# Patient Record
Sex: Male | Born: 1965
Health system: Southern US, Community
[De-identification: ages and names within clinical notes are randomized; demographics above are authoritative.]

## PROBLEM LIST (undated history)

## (undated) DIAGNOSIS — N486 Induration penis plastica: Secondary | ICD-10-CM

## (undated) DIAGNOSIS — N200 Calculus of kidney: Secondary | ICD-10-CM

## (undated) DIAGNOSIS — N529 Male erectile dysfunction, unspecified: Secondary | ICD-10-CM

## (undated) DIAGNOSIS — E785 Hyperlipidemia, unspecified: Secondary | ICD-10-CM

## (undated) HISTORY — DX: Induration penis plastica: N48.6

## (undated) HISTORY — PX: OTHER SURGICAL HISTORY: SHX169

## (undated) HISTORY — DX: Male erectile dysfunction, unspecified: N52.9

## (undated) HISTORY — PX: POLYPECTOMY: SHX149

## (undated) HISTORY — DX: Calculus of kidney: N20.0

## (undated) HISTORY — DX: Hyperlipidemia, unspecified: E78.5

## (undated) HISTORY — PX: COLONOSCOPY: SHX174

---

## 2002-02-03 ENCOUNTER — Encounter: Payer: Self-pay | Admitting: Family Medicine

## 2002-02-03 ENCOUNTER — Encounter: Admission: RE | Admit: 2002-02-03 | Discharge: 2002-02-03 | Payer: Self-pay | Admitting: Family Medicine

## 2002-03-10 ENCOUNTER — Ambulatory Visit (HOSPITAL_BASED_OUTPATIENT_CLINIC_OR_DEPARTMENT_OTHER): Admission: RE | Admit: 2002-03-10 | Discharge: 2002-03-10 | Payer: Self-pay | Admitting: Urology

## 2002-03-10 ENCOUNTER — Encounter: Payer: Self-pay | Admitting: Urology

## 2005-07-25 ENCOUNTER — Encounter: Admission: RE | Admit: 2005-07-25 | Discharge: 2005-07-25 | Payer: Self-pay | Admitting: Family Medicine

## 2007-04-12 ENCOUNTER — Ambulatory Visit: Payer: Self-pay | Admitting: Family Medicine

## 2007-04-13 ENCOUNTER — Encounter (INDEPENDENT_AMBULATORY_CARE_PROVIDER_SITE_OTHER): Payer: Self-pay | Admitting: *Deleted

## 2007-04-13 LAB — CONVERTED CEMR LAB
Cholesterol: 186 mg/dL (ref 0–200)
Direct LDL: 95 mg/dL
Glucose, Bld: 233 mg/dL — ABNORMAL HIGH (ref 70–99)
HDL: 35.1 mg/dL — ABNORMAL LOW (ref >39.0)
PSA: 0.51 ng/mL (ref 0.10–4.00)
Triglycerides: 317 mg/dL (ref 0–149)

## 2007-05-10 ENCOUNTER — Ambulatory Visit: Payer: Self-pay | Admitting: Family Medicine

## 2007-05-12 ENCOUNTER — Telehealth (INDEPENDENT_AMBULATORY_CARE_PROVIDER_SITE_OTHER): Payer: Self-pay | Admitting: *Deleted

## 2007-05-12 LAB — CONVERTED CEMR LAB
Creatinine,U: 117.1 mg/dL
Microalb Creat Ratio: 17.1 mg/g (ref 0.0–30.0)
Microalb, Ur: 2 mg/dL — ABNORMAL HIGH (ref 0.0–1.9)

## 2007-05-19 ENCOUNTER — Ambulatory Visit: Payer: Self-pay | Admitting: Family Medicine

## 2007-05-19 DIAGNOSIS — E119 Type 2 diabetes mellitus without complications: Secondary | ICD-10-CM

## 2007-05-25 LAB — CONVERTED CEMR LAB
CO2: 29 meq/L (ref 19–32)
Calcium: 9.5 mg/dL (ref 8.4–10.5)
GFR calc Af Amer: 106 mL/min
Glucose, Bld: 230 mg/dL — ABNORMAL HIGH (ref 70–99)
Potassium: 3.8 meq/L (ref 3.5–5.1)

## 2007-05-26 ENCOUNTER — Encounter (INDEPENDENT_AMBULATORY_CARE_PROVIDER_SITE_OTHER): Payer: Self-pay | Admitting: *Deleted

## 2007-06-01 ENCOUNTER — Telehealth (INDEPENDENT_AMBULATORY_CARE_PROVIDER_SITE_OTHER): Payer: Self-pay | Admitting: Family Medicine

## 2007-06-07 ENCOUNTER — Encounter: Admission: RE | Admit: 2007-06-07 | Discharge: 2007-06-07 | Payer: Self-pay | Admitting: Family Medicine

## 2007-06-07 ENCOUNTER — Encounter (INDEPENDENT_AMBULATORY_CARE_PROVIDER_SITE_OTHER): Payer: Self-pay | Admitting: Family Medicine

## 2007-07-09 ENCOUNTER — Ambulatory Visit: Payer: Self-pay | Admitting: Internal Medicine

## 2007-08-24 ENCOUNTER — Encounter: Payer: Self-pay | Admitting: Internal Medicine

## 2007-09-10 ENCOUNTER — Ambulatory Visit: Payer: Self-pay | Admitting: Internal Medicine

## 2007-09-14 ENCOUNTER — Encounter (INDEPENDENT_AMBULATORY_CARE_PROVIDER_SITE_OTHER): Payer: Self-pay | Admitting: *Deleted

## 2007-09-14 LAB — CONVERTED CEMR LAB
AST: 21 units/L (ref 0–37)
Basophils Absolute: 0 10*3/uL (ref 0.0–0.1)
Basophils Relative: 0.1 % (ref 0.0–1.0)
Creatinine, Ser: 1 mg/dL (ref 0.4–1.5)
Eosinophils Absolute: 0.1 10*3/uL (ref 0.0–0.7)
Hemoglobin: 16.3 g/dL (ref 13.0–17.0)
Hgb A1c MFr Bld: 5.7 % (ref 4.6–6.0)
MCHC: 34.2 g/dL (ref 30.0–36.0)
Monocytes Absolute: 0.4 10*3/uL (ref 0.1–1.0)
Neutro Abs: 2.4 10*3/uL (ref 1.4–7.7)

## 2007-12-14 ENCOUNTER — Telehealth (INDEPENDENT_AMBULATORY_CARE_PROVIDER_SITE_OTHER): Payer: Self-pay | Admitting: *Deleted

## 2007-12-22 ENCOUNTER — Ambulatory Visit: Payer: Self-pay | Admitting: Internal Medicine

## 2007-12-23 ENCOUNTER — Encounter (INDEPENDENT_AMBULATORY_CARE_PROVIDER_SITE_OTHER): Payer: Self-pay | Admitting: *Deleted

## 2007-12-23 LAB — CONVERTED CEMR LAB
Cholesterol: 157 mg/dL (ref 0–200)
Hgb A1c MFr Bld: 5.3 % (ref 4.6–6.0)
Triglycerides: 134 mg/dL (ref 0–149)

## 2007-12-25 ENCOUNTER — Emergency Department (HOSPITAL_BASED_OUTPATIENT_CLINIC_OR_DEPARTMENT_OTHER): Admission: EM | Admit: 2007-12-25 | Discharge: 2007-12-25 | Payer: Self-pay | Admitting: Emergency Medicine

## 2008-05-01 ENCOUNTER — Ambulatory Visit: Payer: Self-pay | Admitting: Internal Medicine

## 2008-05-01 LAB — HM DIABETES FOOT EXAM

## 2008-05-08 LAB — CONVERTED CEMR LAB
ALT: 16 units/L (ref 0–53)
AST: 18 units/L (ref 0–37)
BUN: 11 mg/dL (ref 6–23)
Chloride: 102 meq/L (ref 96–112)
Creatinine,U: 120.9 mg/dL
GFR calc Af Amer: 120 mL/min
GFR calc non Af Amer: 99 mL/min
Glucose, Bld: 87 mg/dL (ref 70–99)
Microalb Creat Ratio: 3.3 mg/g (ref 0.0–30.0)
Microalb, Ur: 0.4 mg/dL (ref 0.0–1.9)

## 2008-08-14 ENCOUNTER — Telehealth (INDEPENDENT_AMBULATORY_CARE_PROVIDER_SITE_OTHER): Payer: Self-pay | Admitting: *Deleted

## 2008-11-13 ENCOUNTER — Ambulatory Visit: Payer: Self-pay | Admitting: Internal Medicine

## 2008-11-14 ENCOUNTER — Encounter (INDEPENDENT_AMBULATORY_CARE_PROVIDER_SITE_OTHER): Payer: Self-pay | Admitting: *Deleted

## 2008-11-24 ENCOUNTER — Ambulatory Visit: Payer: Self-pay | Admitting: Internal Medicine

## 2008-11-28 ENCOUNTER — Ambulatory Visit: Payer: Self-pay | Admitting: Internal Medicine

## 2008-11-30 ENCOUNTER — Encounter (INDEPENDENT_AMBULATORY_CARE_PROVIDER_SITE_OTHER): Payer: Self-pay | Admitting: *Deleted

## 2008-11-30 LAB — CONVERTED CEMR LAB
ALT: 18 units/L (ref 0–53)
AST: 20 units/L (ref 0–37)
Basophils Relative: 0.4 % (ref 0.0–3.0)
CO2: 31 meq/L (ref 19–32)
Chloride: 103 meq/L (ref 96–112)
Eosinophils Relative: 1.7 % (ref 0.0–5.0)
GFR calc non Af Amer: 98.12 mL/min (ref >60)
Glucose, Bld: 101 mg/dL — ABNORMAL HIGH (ref 70–99)
LDL Cholesterol: 92 mg/dL (ref 0–99)
Lymphocytes Relative: 41.1 % (ref 12.0–46.0)
MCV: 93.8 fL (ref 78.0–100.0)
Neutrophils Relative %: 49.5 % (ref 43.0–77.0)
Platelets: 175 10*3/uL (ref 150.0–400.0)
Potassium: 4.4 meq/L (ref 3.5–5.1)
RBC: 4.91 M/uL (ref 4.22–5.81)
RDW: 11.8 % (ref 11.5–14.6)
Total CHOL/HDL Ratio: 4

## 2009-03-12 ENCOUNTER — Ambulatory Visit: Payer: Self-pay | Admitting: Internal Medicine

## 2009-03-19 ENCOUNTER — Encounter (INDEPENDENT_AMBULATORY_CARE_PROVIDER_SITE_OTHER): Payer: Self-pay | Admitting: *Deleted

## 2009-05-28 ENCOUNTER — Ambulatory Visit: Payer: Self-pay | Admitting: Internal Medicine

## 2009-06-19 ENCOUNTER — Encounter (INDEPENDENT_AMBULATORY_CARE_PROVIDER_SITE_OTHER): Payer: Self-pay | Admitting: *Deleted

## 2009-07-17 ENCOUNTER — Ambulatory Visit: Payer: Self-pay | Admitting: Internal Medicine

## 2009-07-17 ENCOUNTER — Encounter: Payer: Self-pay | Admitting: Internal Medicine

## 2009-07-20 LAB — CONVERTED CEMR LAB: Hgb A1c MFr Bld: 5.4 % (ref 4.6–6.5)

## 2010-03-28 ENCOUNTER — Telehealth (INDEPENDENT_AMBULATORY_CARE_PROVIDER_SITE_OTHER): Payer: Self-pay | Admitting: *Deleted

## 2010-03-29 ENCOUNTER — Telehealth (INDEPENDENT_AMBULATORY_CARE_PROVIDER_SITE_OTHER): Payer: Self-pay | Admitting: *Deleted

## 2010-04-01 ENCOUNTER — Ambulatory Visit
Admission: RE | Admit: 2010-04-01 | Discharge: 2010-04-01 | Payer: Self-pay | Source: Home / Self Care | Attending: Internal Medicine | Admitting: Internal Medicine

## 2010-04-01 ENCOUNTER — Other Ambulatory Visit: Payer: Self-pay | Admitting: Internal Medicine

## 2010-04-01 DIAGNOSIS — F17200 Nicotine dependence, unspecified, uncomplicated: Secondary | ICD-10-CM | POA: Insufficient documentation

## 2010-04-01 LAB — CBC WITH DIFFERENTIAL/PLATELET
Basophils Absolute: 0 10*3/uL (ref 0.0–0.1)
Basophils Relative: 0.4 % (ref 0.0–3.0)
Eosinophils Absolute: 0.1 10*3/uL (ref 0.0–0.7)
Eosinophils Relative: 1.4 % (ref 0.0–5.0)
HCT: 45.5 % (ref 39.0–52.0)
Hemoglobin: 15.6 g/dL (ref 13.0–17.0)
Lymphocytes Relative: 39.2 % (ref 12.0–46.0)
Lymphs Abs: 2.4 10*3/uL (ref 0.7–4.0)
MCHC: 34.3 g/dL (ref 30.0–36.0)
MCV: 93.4 fl (ref 78.0–100.0)
Monocytes Absolute: 0.4 10*3/uL (ref 0.1–1.0)
Monocytes Relative: 7.2 % (ref 3.0–12.0)
Neutro Abs: 3.1 10*3/uL (ref 1.4–7.7)
Neutrophils Relative %: 51.8 % (ref 43.0–77.0)
Platelets: 185 10*3/uL (ref 150.0–400.0)
RBC: 4.87 Mil/uL (ref 4.22–5.81)
RDW: 12.4 % (ref 11.5–14.6)
WBC: 6 10*3/uL (ref 4.5–10.5)

## 2010-04-01 LAB — BASIC METABOLIC PANEL
BUN: 13 mg/dL (ref 6–23)
CO2: 26 mEq/L (ref 19–32)
Calcium: 10 mg/dL (ref 8.4–10.5)
Chloride: 103 mEq/L (ref 96–112)
Creatinine, Ser: 0.9 mg/dL (ref 0.4–1.5)
GFR: 101.39 mL/min (ref >60.00)
Glucose, Bld: 123 mg/dL — ABNORMAL HIGH (ref 70–99)
Potassium: 4.4 mEq/L (ref 3.5–5.1)
Sodium: 138 mEq/L (ref 135–145)

## 2010-04-01 LAB — LIPID PANEL
Cholesterol: 177 mg/dL (ref 0–200)
HDL: 43 mg/dL (ref >39.00)
Total CHOL/HDL Ratio: 4
Triglycerides: 249 mg/dL — ABNORMAL HIGH (ref 0.0–149.0)
VLDL: 49.8 mg/dL — ABNORMAL HIGH (ref 0.0–40.0)

## 2010-04-01 LAB — HEMOGLOBIN A1C: Hgb A1c MFr Bld: 5.9 % (ref 4.6–6.5)

## 2010-04-01 LAB — AST: AST: 27 U/L (ref 0–37)

## 2010-04-01 LAB — ALT: ALT: 39 U/L (ref 0–53)

## 2010-04-01 LAB — LDL CHOLESTEROL, DIRECT: Direct LDL: 107.1 mg/dL

## 2010-04-23 NOTE — Assessment & Plan Note (Signed)
Summary: 4 MONTH OV//PH   Vital Signs:  Patient profile:   45 year old male Height:      73 inches  Vitals Entered By: Shary Decamp (July 17, 2009 8:38 AM) CC: pt not seen due to MD having a meeting.  Patient fasting, labs drawn, pt will reschedule appt   Allergies: No Known Drug Allergies   Complete Medication List: 1)  Freestyle Lite Strp (Glucose blood) .... Use as directed once daily 2)  Freestyle Lancets Misc (Lancets) .... Use one daily as directed. 3)  Glucophage 500 Mg Tabs (Metformin hcl) .... 1/2 by mouth two times a day 4)  Amoxicillin 500 Mg Caps (Amoxicillin) .... 2 by mouth two times a day

## 2010-04-23 NOTE — Assessment & Plan Note (Signed)
Summary: fever/congestion in chest/kdc   Vital Signs:  Patient profile:   45 year old male Height:      73 inches Weight:      210 pounds Temp:     98.4 degrees F BP sitting:   122 / 80  Vitals Entered By: Shary Decamp (May 28, 2009 11:30 AM) CC: acute only Comments  - started with PN drip, cough 1 week ago  - bloody nasal d/c, fever x yesterday Shary Decamp  May 28, 2009 11:31 AM    History of Present Illness: sick  for 7 to 10 days, call, postnasal dripping a few days ago, he had an increased amount of nasal discharge with some blood his daughter was diagnosed with bilateral otitis and pharyngitis  Allergies: No Known Drug Allergies  Past History:  Past Medical History: Reviewed history from 07/09/2007 and no changes required. Diabetes mellitus, type II Dx 2-09  Past Surgical History: Reviewed history from 04/12/2007 and no changes required. kidney stone extraction  Social History: Reviewed history from 11/13/2008 and no changes required. Occupation:  Administrator, arts Married Drug use-no 3 children Regular exercise-yes (walks daily) diet-- healthy Never Smoked Alcohol use-no  Review of Systems       developed fever yesterday no nausea or vomiting no chest pain or shortness of breath.  No chest congestion he does have sinus tenderness, no sore throat  Physical Exam  General:  alert and well-developed.  no apparent distress Head:  face symmetric, no tender  to the patient Ears:  R ear normal and L ear normal.   Nose:  congested Mouth:  no redness or discharge Lungs:  normal respiratory effort, no intercostal retractions, no accessory muscle use, and normal breath sounds.   Heart:  normal rate, regular rhythm, and no murmur.     Impression & Recommendations:  Problem # 1:  SINUSITIS - ACUTE-NOS (ICD-461.9) see instructions His updated medication list for this problem includes:    Amoxicillin 500 Mg Caps (Amoxicillin) .Marland Kitchen... 2 by mouth  two times a day  Complete Medication List: 1)  Freestyle Lite Strp (Glucose blood) .... Use as directed once daily 2)  Freestyle Lancets Misc (Lancets) .... Use one daily as directed. 3)  Glucophage 500 Mg Tabs (Metformin hcl) .... 1/2 by mouth two times a day 4)  Amoxicillin 500 Mg Caps (Amoxicillin) .... 2 by mouth two times a day  Patient Instructions: 1)  Get plenty of rest, drink lots of clear liquids, and use Tylenol 2)  mucinex DM as needed for cough 3)  sudafed 30mg  (behind the counter) 4 times a day for nasal congestion 4)  amoxicillin , antibiotic, x 10 days  5)  call if no better in few days  Prescriptions: AMOXICILLIN 500 MG CAPS (AMOXICILLIN) 2 by mouth two times a day  #40 x 0   Entered and Authorized by:   Nolon Rod. Marvelle Caudill MD   Signed by:   Nolon Rod. Darryll Raju MD on 05/28/2009   Method used:   Electronically to        [facility] (retail)       [address]       [place], [place]  [address]       Ph: [phone]       Fax: [phone]   RxID:   [id number]

## 2010-04-23 NOTE — Letter (Signed)
Summary: Primary Care Appointment Letter  [facility]  [address] [place], [place] [address]   Phone: [phone]  Fax: [phone]    06/19/2009 MRN: [id number]  Gregory Williams [address] [place], [place]  [address]  Dear Mr. [name],   Your Primary Care Physician Nelliston E. Paz MD has indicated that:    _______it is time to schedule an appointment.    _______you missed your appointment on______ and need to call and          reschedule.    _______you need to have lab work done.    _______you need to schedule an appointment discuss lab or test results.    ____x___you need to call to reschedule your appointment that is                       scheduled on _april 20,2011 @ 10:00sm________.     Please call our office as soon as possible. Our phone number is [phone]          [phone]_______. Please press option 1. Our office is open 8a-12noon and 1p-5p, Monday through Friday.     Thank you,    [facility]

## 2010-04-23 NOTE — Letter (Signed)
Summary: Primary Care Appointment Letter  [facility]  [address] [place], [place] [address]   Phone: [phone]  Fax: [phone]    06/19/2009 MRN: [id number]  ADYNN CASERES [address] [place], [place]  [address]  Dear Mr. [name],   Your Primary Care Physician Mart E. Paz MD has indicated that:    _______it is time to schedule an appointment.    _______you missed your appointment on______ and need to call and          reschedule.    _______you need to have lab work done.    _______you need to schedule an appointment discuss lab or test results.    _______you need to call to reschedule your appointment that is                       scheduled on _________.     Please call our office as soon as possible. Our phone number is [phone]          _________. Please press option 1. Our office is open 8a-12noon and 1p-5p, Monday through Friday.     Thank you,    [facility]

## 2010-04-25 NOTE — Progress Notes (Signed)
Summary: CPX due  Phone Note Outgoing Call   Call placed by: Army Fossa CMA,  March 28, 2010 9:13 AM Summary of Call: Pt is due for CPX, please schedule. Army Fossa CMA  March 28, 2010 9:13 AM   Follow-up for Phone Call        Patient now has an appt on 1.9.12. Follow-up by: Harold Barban,  March 28, 2010 10:56 AM

## 2010-04-25 NOTE — Progress Notes (Signed)
Summary: Refill Request  Phone Note Refill Request Call back at Home Phone [phone] Message from:  Patient on March 28, 2010 10:56 AM  Refills Requested: Medication #1:  Accu Check for OneTouch Plan   Notes: Needs new meter, lancets and strips due to insurance change. [facility] [address]  Next Appointment Scheduled: 1.9.12 Initial call taken by: Harold Barban,  March 28, 2010 10:56 AM    New/Updated Medications: ACCU-CHEK SOFT TOUCH DEVICE  MISC (LANCET DEVICES) as directed. ACCU-CHEK SOFT TOUCH LANCETS  MISC (LANCETS) as directed. ACCU-CHEK ADVANTAGE TEST  STRP (GLUCOSE BLOOD) as directed. Prescriptions: ACCU-CHEK ADVANTAGE TEST  STRP (GLUCOSE BLOOD) as directed.  #3 month x 3   Entered by:   Army Fossa CMA   Authorized by:   Nolon Rod. Paz MD   Signed by:   Army Fossa CMA on 03/28/2010   Method used:   Faxed to ...       [facility] (retail)       [address]       [place], [place]  [address]       Ph: [phone]       Fax: [phone]   RxID:   [id number] ACCU-CHEK SOFT TOUCH LANCETS  MISC (LANCETS) as directed.  #3 month x 3   Entered by:   Army Fossa CMA   Authorized by:   Nolon Rod. Paz MD   Signed by:   Army Fossa CMA on 03/28/2010   Method used:   Faxed to ...       [facility] (retail)       [address]       [place], [place]  [address]       Ph: [phone]       Fax: [phone]   RxID:   [id number] ACCU-CHEK SOFT TOUCH DEVICE  MISC (LANCET DEVICES) as directed.  #1 x 0   Entered by:   Army Fossa CMA   Authorized by:   Nolon Rod. Paz MD   Signed by:   Army Fossa CMA on 03/28/2010   Method used:   Faxed to ...       [facility] (retail)       [address]       [place], [place]  [address]       Ph: [phone]       Fax: [phone]   RxID:   [id number]   Appended Document: Refill  Request    Clinical Lists Changes  Medications: Added new medication of * ACCU-CHEK METER qd

## 2010-04-25 NOTE — Progress Notes (Signed)
  Phone Note Call from Patient   Caller: Daughter Summary of Call: Pts daughter called back and would like for a one touch meter to be sent in, his insurance will cover. Army Fossa CMA  March 29, 2010 2:40 PM     New/Updated Medications: * ONETOUCH GLUCOSE METER. test once daily- dx 250.00 ONETOUCH ULTRASOFT LANCETS  MISC (LANCETS) test once daily. dx 250.00 ONETOUCH ULTRA BLUE  STRP (GLUCOSE BLOOD) test once daily. dx 250.00 Prescriptions: ONETOUCH ULTRA BLUE  STRP (GLUCOSE BLOOD) test once daily. dx 250.00  #3 month x 3   Entered by:   Army Fossa CMA   Authorized by:   Nolon Rod. Paz MD   Signed by:   Army Fossa CMA on 03/29/2010   Method used:   Faxed to ...       [facility] (retail)       [address]       [place], [place]  [address]       Ph: [phone]       Fax: [phone]   RxID:   [id number] [facility] LANCETS  MISC (LANCETS) test once daily. dx 250.00  #3 month x 3    Entered by:   Army Fossa CMA   Authorized by:   Nolon Rod. Paz MD   Signed by:   Army Fossa CMA on 03/29/2010   Method used:   Faxed to ...       [facility] (retail)       [address]       [place], [place]  [address]       Ph: [phone]       Fax: [phone]   RxID:   [id number] [facility] GLUCOSE METER. test once daily- dx 250.00  #1 x 0   Entered by:   Army Fossa CMA   Authorized by:   Nolon Rod. Paz MD   Signed by:   Army Fossa CMA on 03/29/2010   Method used:   Faxed to ...       [facility] (retail)       [address]       [place], [place]  [address]       Ph: [phone]       Fax: [phone]   RxID:   [id number]

## 2010-04-25 NOTE — Assessment & Plan Note (Signed)
Summary: cpx//pt will fasting//lch   Vital Signs:  Patient profile:   45 year old male Height:      73 inches Weight:      233.50 pounds BMI:     30.92 Pulse rate:   84 / minute Pulse rhythm:   regular BP sitting:   142 / 86  (left arm) Cuff size:   regular  Vitals Entered By: Army Fossa CMA (April 01, 2010 8:27 AM) CC: CPX, fasting- no complaints  [facility]   History of Present Illness: complete physical exam here with his wife In the last few months, he has been under a lot of stress, job related. He actually is not doing nearly as well as far as diet and exercise and has gain  weight. See vital signs  Preventive Screening-Counseling & Management  Alcohol-Tobacco     Alcohol drinks/day: <1     Alcohol type: beer  Caffeine-Diet-Exercise     Type of exercise: 3 miles daily      Times/week: 7  Current Medications (verified): 1)  Freestyle Lite   Strp (Glucose Blood) .... Use As Directed Once Daily 2)  Freestyle Lancets   Misc (Lancets) .... Use One Daily As Directed. 3)  Glucophage 500 Mg  Tabs (Metformin Hcl) .... 1/2 By Mouth Two Times A Day. Due For Office Visit. 4)  Onetouch Glucose Meter. .... Test Once Daily- Dx 250.00 5)  Onetouch Ultrasoft Lancets  Misc (Lancets) .... Test Once Daily. Dx 250.00 6)  Onetouch Ultra Blue  Strp (Glucose Blood) .... Test Once Daily. Dx 250.00  Allergies (verified): No Known Drug Allergies  Past History:  Past Medical History: Reviewed history from 07/09/2007 and no changes required. Diabetes mellitus, type II Dx 2-09  Past Surgical History: Reviewed history from 04/12/2007 and no changes required. kidney stone extraction  Family History: Prostate cancer -- F dx  44 years of age. 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 CV:  Denies chest pain or discomfort and swelling of feet. Resp:  Denies cough and shortness of breath. GI:  Denies bloody stools, diarrhea, nausea, and vomiting. GU:  Denies dysuria and hematuria.  Physical Exam  General:  alert and well-developed.   Neck:  no masses.   Lungs:  normal respiratory effort, no intercostal retractions, no accessory muscle use, and normal breath sounds.   Heart:  normal rate, regular rhythm, and no murmur.   Abdomen:  soft, non-tender, no distention, no masses, no guarding, and no rigidity.   Extremities:  no lower extremity edema Psych:  Cognition and judgment appear intact. Alert and cooperative with normal attention span and concentration.    Impression & Recommendations:  Problem # 1:  PREVENTIVE HEALTH CARE (ICD-V70.0) Td 2006 recommend to get a flu shot at the pharmacy  Encourage to go back to his healthy lifestyle. As far as the stress (see HPI) , he thinks things are getting better consequently will reassess in 4 months  FH of late  Colon cancer  iFOB negative 9- 2010, to reassess every 2 years until age 37, then yearly  FH prostate Ca , screening every 2 years until age 22, then yearly  Labs  Orders: Venipuncture (04540) TLB-BMP (Basic Metabolic Panel-BMET) (80048-METABOL) TLB-CBC Platelet - w/Differential (85025-CBCD) TLB-Lipid Panel (80061-LIPID) TLB-AST (SGOT) (84450-SGOT) TLB-ALT (SGPT) (84460-ALT) Specimen Handling (98119)  Complete Medication List: 1)  Freestyle Lite Strp (Glucose blood) .... Use as directed once daily 2)  Freestyle Lancets Misc (Lancets) .... Use one daily as directed. 3)  Glucophage 500 Mg Tabs (Metformin hcl) .... 1/2 by mouth two times a day. due for office visit. 4)  Onetouch Glucose Meter.  .... Test once  daily- dx 250.00 5)  Onetouch Ultrasoft Lancets Misc (Lancets) .... Test once daily. dx 250.00 6)  Onetouch Ultra Blue Strp (Glucose blood) .... Test once daily. dx 250.00  Other Orders: TLB-A1C / Hgb A1C (Glycohemoglobin) (83036-A1C)  Patient Instructions: 1)  Please schedule a follow-up appointment in 4 months .    Orders Added: 1)  Venipuncture [36415] 2)  TLB-BMP (Basic Metabolic Panel-BMET) [80048-METABOL] 3)  TLB-CBC Platelet - w/Differential [85025-CBCD] 4)  TLB-Lipid Panel [80061-LIPID] 5)  TLB-A1C / Hgb A1C (Glycohemoglobin) [83036-A1C] 6)  TLB-AST (SGOT) [84450-SGOT] 7)  TLB-ALT (SGPT) [84460-ALT] 8)  Specimen Handling [99000] 9)  Est. Patient age 35-64 [58]     Risk Factors:  Alcohol use:  yes    Type:  beer    Drinks per day:  <1 Exercise:  yes    Times per week:  7    Type:  3 miles daily

## 2010-05-31 ENCOUNTER — Ambulatory Visit (INDEPENDENT_AMBULATORY_CARE_PROVIDER_SITE_OTHER): Payer: Managed Care, Other (non HMO) | Admitting: Internal Medicine

## 2010-05-31 ENCOUNTER — Encounter: Payer: Self-pay | Admitting: Internal Medicine

## 2010-05-31 DIAGNOSIS — N209 Urinary calculus, unspecified: Secondary | ICD-10-CM

## 2010-05-31 DIAGNOSIS — R319 Hematuria, unspecified: Secondary | ICD-10-CM

## 2010-05-31 LAB — CONVERTED CEMR LAB
Ketones, urine, test strip: NEGATIVE
Specific Gravity, Urine: 1.02
Urobilinogen, UA: 0.2
WBC Urine, dipstick: NEGATIVE
pH: 7.5

## 2010-06-01 ENCOUNTER — Encounter: Payer: Self-pay | Admitting: Internal Medicine

## 2010-06-01 LAB — CONVERTED CEMR LAB: Casts: NONE SEEN /lpf

## 2010-06-03 ENCOUNTER — Other Ambulatory Visit: Payer: Self-pay | Admitting: Internal Medicine

## 2010-06-03 DIAGNOSIS — N200 Calculus of kidney: Secondary | ICD-10-CM

## 2010-06-04 ENCOUNTER — Ambulatory Visit (INDEPENDENT_AMBULATORY_CARE_PROVIDER_SITE_OTHER)
Admission: RE | Admit: 2010-06-04 | Discharge: 2010-06-04 | Disposition: A | Discharge status: Home / Self Care | Payer: Managed Care, Other (non HMO) | Source: Ambulatory Visit | Attending: Internal Medicine | Admitting: Internal Medicine

## 2010-06-04 DIAGNOSIS — N200 Calculus of kidney: Secondary | ICD-10-CM

## 2010-06-11 NOTE — Assessment & Plan Note (Signed)
Summary: kidney stone???--pt not avail until fri///sph   Vital Signs:  Patient profile:   45 year old male Weight:      231.50 pounds Pulse rate:   76 / minute Pulse rhythm:   regular BP sitting:   124 / 82  (left arm) Cuff size:   regular  Vitals Entered By: Army Fossa CMA (May 31, 2010 11:05 AM) CC: Pt here feels like he has a kidney stone  [facility]    History of Present Illness:  3-1/2 weeks ago developed a pain in the right suprapubic area,  no radiation, increase after the patient urinates and has not changed by moving his torso or walking.   at the same time, he developed mild bilateral low back pain which is steady, nor radiation, worse when he sits or stands for too long.  above symptoms reminds him  to the  time he had a kidney stone.  ROS  No fevers  or chills No nausea, vomiting, bowel movements normal No flank or abdominal rash No dysuria or gross hematuria , no testicular pain No mass in the groin or suprapubic area   Current Medications (verified): 1)  Freestyle Lite   Strp (Glucose Blood) .... Use As Directed Once Daily 2)  Freestyle Lancets   Misc (Lancets) .... Use One Daily As Directed. 3)  Glucophage 500 Mg  Tabs (Metformin Hcl) .... 1/2 By Mouth Two Times A Day. Due For Office Visit. 4)  Onetouch Glucose Meter. .... Test Once Daily- Dx 250.00 5)  Onetouch Ultrasoft Lancets  Misc (Lancets) .... Test Once Daily. Dx 250.00 6)  Onetouch Ultra Blue  Strp (Glucose Blood) .... Test Once Daily. Dx 250.00  Allergies (verified): No Known Drug Allergies  Past History:  Past Medical History: Reviewed history from 07/09/2007 and no changes required. Diabetes mellitus, type II Dx 2-09  Past Surgical History: Reviewed history from 04/12/2007 and no changes required. kidney stone extraction  Social History: Reviewed history from 04/01/2010 and no changes required. 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   Abdomen:  soft, non-tender, normal bowel sounds, no distention, no masses, no guarding, no rigidity, and no inguinal hernia.   Genitalia:  uncircumcised, no hydrocele, no varicocele, no scrotal masses, no testicular masses or atrophy, no cutaneous lesions, and no urethral discharge.   Pulses:   normal femoral  pulses bilaterally Extremities:  no lower extremity edema Skin:   no rash noted at the abdomen or flanks   Impression & Recommendations:  Problem # 1:  ? of URINARY CALCULUS (ICD-592.9)   symptoms are atypical for urolithiasis however he presented in a similar fashion before. Urinalysis showed trace of blood  plan:  CT  w/ renal stone protocol  fluids If workup negative, this could be a  musculoskeletal pain.  Orders: Radiology Referral (Radiology)  Complete Medication List: 1)  Freestyle Lite Strp (Glucose blood) .... Use as directed once daily 2)  Freestyle Lancets Misc (Lancets) .... Use one daily as directed. 3)  Glucophage 500 Mg Tabs (Metformin hcl) .... 1/2 by mouth two times a day. due for office visit. 4)  Onetouch Glucose Meter.  .... Test once daily- dx 250.00 5)  Onetouch Ultrasoft Lancets Misc (Lancets) .... Test once daily. dx 250.00 6)  Onetouch Ultra Blue Strp (Glucose blood) .... Test once daily. dx  250.00  Other Orders: UA Dipstick w/o Micro (manual) ([address]) T-Culture, Urine ([address]454-09811) T-Urinalysis (91478-29562)  Patient Instructions: 1)  lots of fluids 2)  call if symptoms increase  3)  will do a CT to check for stones    Orders Added: 1)  UA Dipstick w/o Micro (manual) [81002] 2)  T-Culture, Urine [13086-57846] 3)  T-Urinalysis [81003-65000] 4)  Radiology Referral [Radiology] 5)  Est. Patient Level IV [[address]]    Laboratory Results   Urine Tests    Routine Urinalysis   Color:  yellow Appearance: Clear Glucose: negative   (Normal Range: Negative) Bilirubin: negative   (Normal Range: Negative) Ketone: negative   (Normal Range: Negative) Spec. Gravity: 1.020   (Normal Range: 1.003-1.035) Blood: trace-lysed   (Normal Range: Negative) pH: 7.5   (Normal Range: 5.0-8.0) Protein: negative   (Normal Range: Negative) Urobilinogen: 0.2   (Normal Range: 0-1) Nitrite: negative   (Normal Range: Negative) Leukocyte Esterace: negative   (Normal Range: Negative)    Comments: Army Fossa CMA  May 31, 2010 11:09 AM

## 2010-09-19 ENCOUNTER — Encounter: Payer: Self-pay | Admitting: Internal Medicine

## 2010-09-19 ENCOUNTER — Ambulatory Visit (INDEPENDENT_AMBULATORY_CARE_PROVIDER_SITE_OTHER): Payer: Managed Care, Other (non HMO) | Admitting: Internal Medicine

## 2010-09-19 DIAGNOSIS — IMO0002 Reserved for concepts with insufficient information to code with codable children: Secondary | ICD-10-CM

## 2010-09-19 DIAGNOSIS — E119 Type 2 diabetes mellitus without complications: Secondary | ICD-10-CM

## 2010-09-19 DIAGNOSIS — F419 Anxiety disorder, unspecified: Secondary | ICD-10-CM | POA: Insufficient documentation

## 2010-09-19 DIAGNOSIS — F411 Generalized anxiety disorder: Secondary | ICD-10-CM

## 2010-09-19 DIAGNOSIS — M5410 Radiculopathy, site unspecified: Secondary | ICD-10-CM | POA: Insufficient documentation

## 2010-09-19 LAB — HEMOGLOBIN A1C: Hgb A1c MFr Bld: 6.5 % (ref 4.6–6.5)

## 2010-09-19 MED ORDER — CYCLOBENZAPRINE HCL 10 MG PO TABS: 10.0000 mg | ORAL_TABLET | Freq: Three times a day (TID) | ORAL | Status: AC | PRN

## 2010-09-19 MED ORDER — PREDNISONE 10 MG PO TABS: ORAL_TABLET | ORAL | Status: AC

## 2010-09-19 NOTE — Assessment & Plan Note (Signed)
Counseled  about diet exercise, labs.

## 2010-09-19 NOTE — Progress Notes (Signed)
  Subjective:    Patient ID: Gregory Williams, male    DOB: 02-11-1966, 45 y.o.   MRN: [id number]  HPI Followup on diabetes Additionally, he developed pain and a left trapezoid area 3 weeks ago. The pain started to radiate to the lateral aspect of the left arm shortly after, at this point the symptom is described as pain or numbness involves even the left thumb. The pain varies depending on his torso and arm position. Does not change with neck motion. Admits to a lot of anxiety related to work, we had a long discussion about it. He believes that the pain started when his to stress at work went up.  Past Medical History  Diagnosis Date  . Diabetes mellitus 2/09    Past Surgical History  Procedure Date  . Kidney stone extraction     Review of Systems Diet has not been as good lately Unable to exercise due to her heavy work schedule Ambulatory blood sugars around 130 No shoulder pain per se, no neck pain, no headache or rash.    Objective:   Physical Exam  Constitutional: He is oriented to person, place, and time. He appears well-developed and well-nourished.  Neck:       Neck is full range of motion, no pain to palpation  Musculoskeletal:       Shoulders with normal range of motion without eliciting any pain or symptoms  Neurological: He is alert and oriented to person, place, and time.       Speech, gait and motor are intact. DTRs symmetric.  Psychiatric: He has a normal mood and affect. His behavior is normal. Judgment and thought content normal.          Assessment & Plan:

## 2010-09-19 NOTE — Assessment & Plan Note (Signed)
Had a long conversation about his anxiety, it is related to work. Options: Observation Medication Counseling. Patient will call when/if ready for treatment. Face-to-face more than 25 minutes, more than 50% of this time counseling.

## 2010-09-19 NOTE — Patient Instructions (Signed)
Warm compress to area of pain Prednisone as prescribed-------stop it if blood sugars > 200 Flexeril , muscle relaxant, take only at night if it cause excessive somnolence Tylenol-motrin as needed Call if no better in 10 days or if symptoms  increase

## 2010-09-19 NOTE — Assessment & Plan Note (Addendum)
Upper extremity pain and numbness suggest radiculopathy, recommend conservative treatment, see instructions

## 2010-09-20 ENCOUNTER — Telehealth: Payer: Self-pay | Admitting: *Deleted

## 2010-09-20 NOTE — Telephone Encounter (Signed)
I spoke w/ pts wife Britta Mccreedy she is aware.

## 2010-09-20 NOTE — Telephone Encounter (Signed)
Message left for patient to return my call.  

## 2010-09-20 NOTE — Telephone Encounter (Signed)
Message copied by Leanne Lovely on Fri Sep 20, 2010  2:36 PM ------      Message from: Willow Ora E      Created: Fri Sep 20, 2010  2:24 PM       Advise patient, hemoglobin A1c has increased from 5.9 to 6.5. This is still considered good control. No need for medications, recommended to focus on diet and exercise

## 2010-10-04 ENCOUNTER — Encounter: Payer: Self-pay | Admitting: Internal Medicine

## 2010-10-04 ENCOUNTER — Ambulatory Visit (INDEPENDENT_AMBULATORY_CARE_PROVIDER_SITE_OTHER): Payer: Managed Care, Other (non HMO) | Admitting: Internal Medicine

## 2010-10-04 VITALS — BP 126/80 | HR 98 | Wt 232.4 lb

## 2010-10-04 DIAGNOSIS — IMO0002 Reserved for concepts with insufficient information to code with codable children: Secondary | ICD-10-CM

## 2010-10-04 DIAGNOSIS — M5410 Radiculopathy, site unspecified: Secondary | ICD-10-CM

## 2010-10-04 NOTE — Assessment & Plan Note (Signed)
Patient improved to some extent with prednisone however continue with radicular type of symptoms including numbness on the left thumb. Motrin as needed Refer to orthopedic surgery

## 2010-10-04 NOTE — Patient Instructions (Signed)
Motrin 200 mg  2 or 3 tablets 3 times a day as needed , take with food

## 2010-10-04 NOTE — Progress Notes (Signed)
  Subjective:    Patient ID: Gregory Williams, male    DOB: [birth year], 45 y.o.   MRN: [id number]  HPI Was seen recently with left trapezoid pain and radicular symptoms. Patient took prednisone, symptoms improved to some extent but at this point he still has on and off numbness in the whole arm and pretty much a steady numbness in the left thumb.  Past Medical History  Diagnosis Date  . Diabetes mellitus 2/09   Past Surgical History  Procedure Date  . Kidney stone extraction      Review of Systems No other concerns, no neck pain, no shoulder pain per se    Objective:   Physical Exam Alert, oriented, no apparent distress Neck is full range of motion, nontender to palpation. Neurological exam: Pinprick examination of the upper extremities normal, strength and DTRs symmetric.        Assessment & Plan:

## 2010-12-02 ENCOUNTER — Ambulatory Visit (INDEPENDENT_AMBULATORY_CARE_PROVIDER_SITE_OTHER): Payer: Managed Care, Other (non HMO) | Admitting: Internal Medicine

## 2010-12-02 ENCOUNTER — Encounter: Payer: Self-pay | Admitting: Internal Medicine

## 2010-12-02 VITALS — BP 108/78 | HR 68 | Temp 98.1°F | Resp 14 | Wt 239.0 lb

## 2010-12-02 DIAGNOSIS — L609 Nail disorder, unspecified: Secondary | ICD-10-CM | POA: Insufficient documentation

## 2010-12-02 NOTE — Progress Notes (Signed)
  Subjective:    Patient ID: Gregory Williams, male    DOB: [birth year], 45 y.o.   MRN: [id number]  HPI 3th R toe nail noted to be dark w/o apparent trauma a month ago  Past Medical History  Diagnosis Date  . Diabetes mellitus 2/09     Review of Systems No other c/o     Objective:   Physical Exam AOx3 3 R toe nail dark, hyper coloration going  beyond the nail to the surrounding skin?        Assessment & Plan:  Nail discoloration: No h/o trauma, to derm

## 2010-12-13 ENCOUNTER — Encounter: Payer: Self-pay | Admitting: Family Medicine

## 2010-12-13 ENCOUNTER — Ambulatory Visit (INDEPENDENT_AMBULATORY_CARE_PROVIDER_SITE_OTHER): Payer: Managed Care, Other (non HMO) | Admitting: Family Medicine

## 2010-12-13 VITALS — BP 142/96 | HR 82 | Temp 99.0°F | Wt 235.2 lb

## 2010-12-13 DIAGNOSIS — J069 Acute upper respiratory infection, unspecified: Secondary | ICD-10-CM

## 2010-12-13 MED ORDER — MOMETASONE FUROATE 50 MCG/ACT NA SUSP: 2.0000 | Freq: Every day | NASAL | Status: DC

## 2010-12-13 MED ORDER — AZELASTINE HCL 0.15 % NA SOLN: NASAL | Status: DC

## 2010-12-13 NOTE — Patient Instructions (Signed)
Common Cold, Adult An upper respiratory tract infection, or cold, is a viral infection of the air passages to the lung. Colds are contagious, especially during the first 3 or 4 days. Antibiotics cannot cure a cold. Cold germs are spread by coughs, sneezes, and hand to hand contact. A respiratory tract infection usually clears up in a few days, but some people may be sick for a week or two. HOME CARE INSTRUCTIONS  Only take over-the-counter or prescription medicines for pain, discomfort, or fever as directed by your caregiver.   Be careful not to blow your nose too hard. This may cause a nosebleed.   Use a cool-mist humidifier (vaporizer) to increase air moisture. This will make it easier for you to breath. Do not use hot steam.   Rest as much as possible and get plenty of sleep.   Wash your hands often, especially after you blow your nose. Cover your mouth and nose with a tissue when you sneeze or cough.   Drink at least 8 glasses of clear liquids every day, such as water, fruit juices, tea, clear soups, and carbonated beverages.  SEEK MEDICAL CARE IF:  An oral temperature above 100.4 lasts 4 days or more, and is not controlled by medication.   You have a sore throat that gets worse or you see white or yellow spots in your throat.   Your cough gets worse or lasts more than 10 days.   You have a rash somewhere on your skin. You have large and tender lumps in your neck.   You have an earache or a headache.   You have thick, greenish or yellowish discharge from your nose.   You cough-up thick yellow, green, gray or bloody mucus (secretions).  SEEK IMMEDIATE MEDICAL CARE IF: You have trouble breathing, chest pain, or your skin or nails look gray or blue. MAKE SURE YOU:   Understand these instructions.   Will watch your condition.   Will get help right away if you are not doing well or get worse.  Document Released: 03/07/2000 Document Re-Released: 02/21/2008 Exit[employer] Patient  Information 2011 [employer]. 

## 2010-12-13 NOTE — Progress Notes (Signed)
         Subjective:     Gregory Williams is a 45 y.o. male who presents for evaluation of symptoms of a URI. Symptoms include congestion, coryza, low grade fever, nasal congestion, productive cough with  clear colored sputum and sinus pressure. Onset of symptoms was 5 days ago, and has been gradually improving since that time. Treatment to date: antihistamines and cough suppressants.  The following portions of the patient's history were reviewed and updated as appropriate: allergies, current medications, past family history, past medical history, past social history, past surgical history and problem list.  Review of Systems Pertinent items are noted in HPI.   Objective:    BP 142/96  Pulse 82  Temp(Src) 99 F (37.2 C) (Oral)  Wt 235 lb 3.2 oz (106.686 kg)  SpO2 97% General appearance: alert, cooperative, appears stated age and no distress Ears: normal TM's and external ear canals both ears Nose: clear discharge, mild congestion, turbinates red, swollen, no sinus tenderness Throat: lips, mucosa, and tongue normal; teeth and gums normal Neck: mild anterior cervical adenopathy, supple, symmetrical, trachea midline and thyroid not enlarged, symmetric, no tenderness/mass/nodules Lungs: clear to auscultation bilaterally Heart: regular rate and rhythm, S1, S2 normal, no murmur, click, rub or gallop Lymph nodes: Cervical, supraclavicular, and axillary nodes normal.   Assessment:    viral upper respiratory illness   Plan:    Discussed diagnosis and treatment of URI. Suggested symptomatic OTC remedies. Nasal saline spray for congestion. Nasal steroids per orders. Follow up as needed.

## 2011-01-03 ENCOUNTER — Other Ambulatory Visit: Payer: Self-pay | Admitting: Internal Medicine

## 2011-01-03 NOTE — Telephone Encounter (Signed)
Done

## 2011-01-28 ENCOUNTER — Encounter: Payer: Self-pay | Admitting: Internal Medicine

## 2011-01-29 ENCOUNTER — Ambulatory Visit (INDEPENDENT_AMBULATORY_CARE_PROVIDER_SITE_OTHER): Payer: Managed Care, Other (non HMO) | Admitting: Internal Medicine

## 2011-01-29 ENCOUNTER — Encounter: Payer: Self-pay | Admitting: Internal Medicine

## 2011-01-29 VITALS — BP 136/78 | HR 91 | Temp 98.0°F | Resp 18 | Ht 72.05 in | Wt 239.4 lb

## 2011-01-29 DIAGNOSIS — E119 Type 2 diabetes mellitus without complications: Secondary | ICD-10-CM

## 2011-01-29 DIAGNOSIS — Z23 Encounter for immunization: Secondary | ICD-10-CM

## 2011-01-29 NOTE — Progress Notes (Signed)
  Subjective:    Patient ID: Gregory Williams, male    DOB: [birth year], 45 y.o.   MRN: [id number]  HPI ROV Since the last OV he quit his job, has been some how depress and unmotivated since. Diet-exercise not as good as before. CBGs were at some point in the 200s, now 160s.  Past Medical History  Diagnosis Date  . Diabetes mellitus 2/09   Past Surgical History  Procedure Date  . Kidney stone extraction      Review of Systems No suicidal Got a flu shot today    Objective:   Physical Exam  Constitutional: He is oriented to person, place, and time. He appears well-developed and well-nourished. No distress.  HENT:  Head: Normocephalic and atraumatic.  Cardiovascular: Normal rate, regular rhythm and normal heart sounds.   No murmur heard. Pulmonary/Chest: Effort normal and breath sounds normal. No respiratory distress. He has no wheezes. He has no rales.  Musculoskeletal: He exhibits no edema.  Neurological: He is alert and oriented to person, place, and time.  Skin: Skin is warm and dry.  Psychiatric: He has a normal mood and affect. His behavior is normal. Judgment and thought content normal.      Assessment & Plan:  Quit his job, counseled and encouraged to go back to a good diet, exercise and to actively look for a job

## 2011-01-29 NOTE — Assessment & Plan Note (Addendum)
Encouraged diet-exercise Labs  If A1C slt above 7--->  I'll rec to work on life style and maybe increase metformin dose

## 2011-05-20 ENCOUNTER — Emergency Department (HOSPITAL_BASED_OUTPATIENT_CLINIC_OR_DEPARTMENT_OTHER)
Admission: EM | Admit: 2011-05-20 | Discharge: 2011-05-20 | Disposition: A | Discharge status: Home / Self Care | Payer: Managed Care, Other (non HMO) | Attending: Emergency Medicine | Admitting: Emergency Medicine

## 2011-05-20 ENCOUNTER — Encounter (HOSPITAL_BASED_OUTPATIENT_CLINIC_OR_DEPARTMENT_OTHER): Payer: Self-pay | Admitting: *Deleted

## 2011-05-20 ENCOUNTER — Other Ambulatory Visit: Payer: Self-pay | Admitting: *Deleted

## 2011-05-20 DIAGNOSIS — IMO0002 Reserved for concepts with insufficient information to code with codable children: Secondary | ICD-10-CM

## 2011-05-20 DIAGNOSIS — E119 Type 2 diabetes mellitus without complications: Secondary | ICD-10-CM | POA: Insufficient documentation

## 2011-05-20 DIAGNOSIS — Y93G1 Activity, food preparation and clean up: Secondary | ICD-10-CM | POA: Insufficient documentation

## 2011-05-20 DIAGNOSIS — S61209A Unspecified open wound of unspecified finger without damage to nail, initial encounter: Secondary | ICD-10-CM | POA: Insufficient documentation

## 2011-05-20 DIAGNOSIS — W260XXA Contact with knife, initial encounter: Secondary | ICD-10-CM | POA: Insufficient documentation

## 2011-05-20 DIAGNOSIS — W261XXA Contact with sword or dagger, initial encounter: Secondary | ICD-10-CM | POA: Insufficient documentation

## 2011-05-20 MED ORDER — GLUCOSE BLOOD VI STRP: ORAL_STRIP | Status: DC

## 2011-05-20 MED ORDER — TETANUS-DIPHTH-ACELL PERTUSSIS 5-2.5-18.5 LF-MCG/0.5 IM SUSP
0.5000 mL | Freq: Once | INTRAMUSCULAR | Status: AC
  Administered 2011-05-20: 0.5 mL via INTRAMUSCULAR
  Filled 2011-05-20: qty 0.5

## 2011-05-20 MED ORDER — METFORMIN HCL 500 MG PO TABS: ORAL_TABLET | ORAL | Status: DC

## 2011-05-20 MED ORDER — FREESTYLE LANCETS MISC: Status: DC

## 2011-05-20 MED ORDER — "THROMBI-PAD 3""X3"" EX PADS"
MEDICATED_PAD | CUTANEOUS | Status: AC
  Administered 2011-05-20: 21:00:00
  Filled 2011-05-20: qty 1

## 2011-05-20 MED ORDER — GELATIN ABSORBABLE 12-7 MM EX MISC
CUTANEOUS | Status: AC
  Administered 2011-05-20: 1
  Filled 2011-05-20: qty 1

## 2011-05-20 NOTE — Discharge Instructions (Signed)
Laceration Care, Adult     A laceration is a cut or lesion that goes through all layers of the skin and into the tissue just beneath the skin.  TREATMENT   Some lacerations may not require closure. Some lacerations may not be able to be closed due to an increased risk of infection. It is important to see your caregiver as soon as possible after an injury to minimize the risk of infection and maximize the opportunity for successful closure.  If closure is appropriate, pain medicines may be given, if needed. The wound will be cleaned to help prevent infection. Your caregiver will use stitches (sutures), staples, wound glue (adhesive), or skin adhesive strips to repair the laceration. These tools bring the skin edges together to allow for faster healing and a better cosmetic outcome. However, all wounds will heal with a scar. Once the wound has healed, scarring can be minimized by covering the wound with sunscreen during the day for 1 full year.  HOME CARE INSTRUCTIONS   For sutures or staples:  · Keep the wound clean and dry.   · If you were given a bandage (dressing), you should change it at least once a day. Also, change the dressing if it becomes wet or dirty, or as directed by your caregiver.   · Wash the wound with soap and water 2 times a day. Rinse the wound off with water to remove all soap. Pat the wound dry with a clean towel.   · After cleaning, apply a thin layer of the antibiotic ointment as recommended by your caregiver. This will help prevent infection and keep the dressing from sticking.   · You may shower as usual after the first 24 hours. Do not soak the wound in water until the sutures are removed.   · Only take over-the-counter or prescription medicines for pain, discomfort, or fever as directed by your caregiver.   · Get your sutures or staples removed as directed by your caregiver.   For skin adhesive strips:  · Keep the wound clean and dry.   · Do not get the skin adhesive strips wet. You may  bathe carefully, using caution to keep the wound dry.   · If the wound gets wet, pat it dry with a clean towel.   · Skin adhesive strips will fall off on their own. You may trim the strips as the wound heals. Do not remove skin adhesive strips that are still stuck to the wound. They will fall off in time.   For wound adhesive:  · You may briefly wet your wound in the shower or bath. Do not soak or scrub the wound. Do not swim. Avoid periods of heavy perspiration until the skin adhesive has fallen off on its own. After showering or bathing, gently pat the wound dry with a clean towel.   · Do not apply liquid medicine, cream medicine, or ointment medicine to your wound while the skin adhesive is in place. This may loosen the film before your wound is healed.   · If a dressing is placed over the wound, be careful not to apply tape directly over the skin adhesive. This may cause the adhesive to be pulled off before the wound is healed.   · Avoid prolonged exposure to sunlight or tanning lamps while the skin adhesive is in place. Exposure to ultraviolet light in the first year will darken the scar.   · The skin adhesive will usually remain in place for   5 to 10 days, then naturally fall off the skin. Do not pick at the adhesive film.   You may need a tetanus shot if:  · You cannot remember when you had your last tetanus shot.   · You have never had a tetanus shot.   If you get a tetanus shot, your arm may swell, get red, and feel warm to the touch. This is common and not a problem. If you need a tetanus shot and you choose not to have one, there is a rare chance of getting tetanus. Sickness from tetanus can be serious.  SEEK MEDICAL CARE IF:   · You have redness, swelling, or increasing pain in the wound.   · You see a red line that goes away from the wound.   · You have yellowish-white fluid (pus) coming from the wound.   · You have a fever.   · You notice a bad smell coming from the wound or dressing.   · Your wound  breaks open before or after sutures have been removed.   · You notice something coming out of the wound such as wood or glass.   · Your wound is on your hand or foot and you cannot move a finger or toe.   SEEK IMMEDIATE MEDICAL CARE IF:   · Your pain is not controlled with prescribed medicine.   · You have severe swelling around the wound causing pain and numbness or a change in color in your arm, hand, leg, or foot.   · Your wound splits open and starts bleeding.   · You have worsening numbness, weakness, or loss of function of any joint around or beyond the wound.   · You develop painful lumps near the wound or on the skin anywhere on your body.   MAKE SURE YOU:   · Understand these instructions.   · Will watch your condition.   · Will get help right away if you are not doing well or get worse.   Document Released: 03/10/2005 Document Revised: 11/20/2010 Document Reviewed: 09/03/2010  Exit[employer]® Patient Information ©2012 [employer].

## 2011-05-20 NOTE — Telephone Encounter (Signed)
Rx sent 

## 2011-05-20 NOTE — ED Provider Notes (Signed)
History     CSN: [id number]  Arrival date & time 05/20/11  2021   First MD Initiated Contact with Patient 05/20/11 2058      Chief Complaint  Patient presents with  . Laceration    (Consider location/radiation/quality/duration/timing/severity/associated sxs/prior treatment) HPI Comments: Pt states that he was cutting vegetables and cut a piece out of his right ring finger  Patient is a 46 y.o. male presenting with skin laceration. The history is provided by the patient. No language interpreter was used.  Laceration  The incident occurred less than 1 hour ago. The laceration is located on the left hand. The laceration is 1 cm in size. The laceration mechanism was a a clean knife. The pain is mild. The pain has been constant since onset. He reports no foreign bodies present. His tetanus status is out of date.    Past Medical History  Diagnosis Date  . Diabetes mellitus 2/09    Past Surgical History  Procedure Date  . Kidney stone extraction     Family History  Problem Relation Age of Onset  . Prostate cancer Father 105  . Diabetes Father   . Parkinsonism Father   . Cancer Father 50    prostate  . Colon cancer Maternal Grandmother     dx late in life  . Cancer Maternal Grandmother     colon  . Colon polyps Mother     in her 50s  . Hypertension Mother   . Diabetes      father family  . Stroke Neg Hx   . Heart attack Neg Hx   . Diabetes Paternal Aunt   . Diabetes Paternal Aunt     History  Substance Use Topics  . Smoking status: Never Smoker   . Smokeless tobacco: Not on file  . Alcohol Use: No      Review of Systems  All other systems reviewed and are negative.    Allergies  Review of patient's allergies indicates no known allergies.  Home Medications   Current Outpatient Rx  Name Route Sig Dispense Refill  . CALCIUM CARBONATE ANTACID 500 MG PO CHEW Oral Chew 1 tablet by mouth once as needed. For indigestion    . METFORMIN HCL 500 MG PO TABS   TAKE HALF TABLET BY MOUTH TWICE DAILY 60 tablet 1  . ADULT MULTIVITAMIN W/MINERALS CH Oral Take 1 tablet by mouth daily.      BP 141/99  Pulse 75  Temp(Src) 98 F (36.7 C) (Oral)  Resp 16  Ht 6' (1.829 m)  Wt 240 lb (108.863 kg)  BMI 32.55 kg/m2  SpO2 100%  Physical Exam  Nursing note and vitals reviewed. Constitutional: He is oriented to person, place, and time. He appears well-developed and well-nourished.  HENT:  Head: Normocephalic and atraumatic.  Cardiovascular: Normal rate and regular rhythm.   Pulmonary/Chest: Effort normal and breath sounds normal.  Musculoskeletal: Normal range of motion.  Neurological: He is alert and oriented to person, place, and time.  Skin:       Pt has skin avulsion to the tip of left ring finger  Psychiatric: He has a normal mood and affect.    ED Course  Procedures (including critical care time)  Labs Reviewed - No data to display No results found.   1. Laceration       MDM  Gelfoam placed and bleeding controlled:pt tetanus updated        Teressa Lower, NP 05/20/11 2201

## 2011-05-20 NOTE — ED Notes (Signed)
Pt c/o left ring finger laceration

## 2011-05-22 NOTE — ED Provider Notes (Signed)
Medical screening examination/treatment/procedure(s) were performed by non-physician practitioner and as supervising physician I was immediately available for consultation/collaboration.  Cyndra Numbers, MD 05/22/11 [phone]

## 2012-01-05 ENCOUNTER — Other Ambulatory Visit: Payer: Self-pay | Admitting: Internal Medicine

## 2012-01-05 NOTE — Telephone Encounter (Signed)
Refill done.  

## 2012-01-08 ENCOUNTER — Other Ambulatory Visit: Payer: Self-pay | Admitting: Internal Medicine

## 2012-01-08 NOTE — Telephone Encounter (Signed)
Refill done.  

## 2012-01-08 NOTE — Telephone Encounter (Signed)
Not showing filled strips yet lancets filled 01/05/12. OV/ labs 01/29/11.   PLz advise    MW

## 2012-04-07 ENCOUNTER — Encounter: Payer: Self-pay | Admitting: Internal Medicine

## 2012-04-07 ENCOUNTER — Ambulatory Visit (INDEPENDENT_AMBULATORY_CARE_PROVIDER_SITE_OTHER): Payer: Managed Care, Other (non HMO) | Admitting: Internal Medicine

## 2012-04-07 VITALS — BP 132/82 | HR 73 | Temp 97.9°F | Ht 73.0 in | Wt 240.0 lb

## 2012-04-07 DIAGNOSIS — F419 Anxiety disorder, unspecified: Secondary | ICD-10-CM

## 2012-04-07 DIAGNOSIS — Z23 Encounter for immunization: Secondary | ICD-10-CM

## 2012-04-07 DIAGNOSIS — E119 Type 2 diabetes mellitus without complications: Secondary | ICD-10-CM

## 2012-04-07 DIAGNOSIS — Z0000 Encounter for general adult medical examination without abnormal findings: Secondary | ICD-10-CM | POA: Insufficient documentation

## 2012-04-07 LAB — MICROALBUMIN / CREATININE URINE RATIO
Creatinine,U: 194.8 mg/dL
Microalb Creat Ratio: 1.1 mg/g (ref 0.0–30.0)
Microalb, Ur: 2.1 mg/dL — ABNORMAL HIGH (ref 0.0–1.9)

## 2012-04-07 LAB — COMPREHENSIVE METABOLIC PANEL WITH GFR
ALT: 62 U/L — ABNORMAL HIGH (ref 0–53)
AST: 36 U/L (ref 0–37)
Albumin: 4.6 g/dL (ref 3.5–5.2)
Alkaline Phosphatase: 55 U/L (ref 39–117)
BUN: 14 mg/dL (ref 6–23)
CO2: 26 meq/L (ref 19–32)
Calcium: 9.6 mg/dL (ref 8.4–10.5)
Chloride: 102 meq/L (ref 96–112)
Creatinine, Ser: 1 mg/dL (ref 0.4–1.5)
GFR: 85.55 mL/min
Glucose, Bld: 235 mg/dL — ABNORMAL HIGH (ref 70–99)
Potassium: 4.1 meq/L (ref 3.5–5.1)
Sodium: 135 meq/L (ref 135–145)
Total Bilirubin: 1 mg/dL (ref 0.3–1.2)
Total Protein: 7.4 g/dL (ref 6.0–8.3)

## 2012-04-07 LAB — LDL CHOLESTEROL, DIRECT: Direct LDL: 118.6 mg/dL

## 2012-04-07 LAB — HEMOGLOBIN A1C: Hgb A1c MFr Bld: 8.9 % — ABNORMAL HIGH (ref 4.6–6.5)

## 2012-04-07 LAB — CBC WITH DIFFERENTIAL/PLATELET
Basophils Absolute: 0 10*3/uL (ref 0.0–0.1)
Basophils Relative: 0.6 % (ref 0.0–3.0)
Eosinophils Absolute: 0 10*3/uL (ref 0.0–0.7)
HCT: 48.1 % (ref 39.0–52.0)
Hemoglobin: 16.3 g/dL (ref 13.0–17.0)
Lymphocytes Relative: 40 % (ref 12.0–46.0)
Lymphs Abs: 1.9 10*3/uL (ref 0.7–4.0)
MCHC: 33.9 g/dL (ref 30.0–36.0)
Neutro Abs: 2.4 10*3/uL (ref 1.4–7.7)
RBC: 5.31 Mil/uL (ref 4.22–5.81)
RDW: 12.6 % (ref 11.5–14.6)

## 2012-04-07 LAB — LIPID PANEL: Cholesterol: 209 mg/dL — ABNORMAL HIGH (ref 0–200)

## 2012-04-07 MED ORDER — METFORMIN HCL 500 MG PO TABS: ORAL_TABLET | ORAL | Status: DC

## 2012-04-07 NOTE — Assessment & Plan Note (Signed)
Some anxiety and depression due to her lack of work however he is spending quality time with his daughters and is very happy about it. At this point he does not need treatment. Patient is counseled  today.

## 2012-04-07 NOTE — Progress Notes (Signed)
  Subjective:    Patient ID: Gregory Williams, male    DOB: [birth year], 47 y.o.   MRN: [id number]  HPI CPX  Past Medical History  Diagnosis Date  . Diabetes mellitus 2/09   Past Surgical History  Procedure Date  . Kidney stone extraction    History   Social History  . Marital Status: Married    Spouse Name: Britta Mccreedy    Number of Children: 3  . Years of Education: N/A   Occupational History  . quit [employer] ~ 6-12, not working at present    Social History Main Topics  . Smoking status: Never Smoker   . Smokeless tobacco: Never Used  . Alcohol Use: Yes     Comment: socially   . Drug Use: No  . Sexually Active: Yes   Other Topics Concern  . Not on file   Social History Narrative   Exercise: none at present ---Diet: very good   Family History  Problem Relation Age of Onset  . Prostate cancer Father 22  . Diabetes Father   . Parkinsonism Father   . Colon cancer Maternal Grandmother     dx late in life  . Colon polyps Mother     in her 30s  . Hypertension Mother   . Diabetes      father family  . Stroke Neg Hx   . Heart attack Neg Hx   . Diabetes Paternal Aunt     Review of Systems No chest pain or shortness of breath No nausea, vomiting, diarrhea or blood in the stools. Denies dysuria or gross hematuria Some stress because is not working, mild  Anxiety and depression. Denies need of  medication.     Objective:   Physical Exam General -- alert, well-developed    Neck --no thyromegaly , normal carotid pulse Lungs -- normal respiratory effort, no intercostal retractions, no accessory muscle use, and normal breath sounds.   Heart-- normal rate, regular rhythm, no murmur, and no gallop.   Abdomen--soft, non-tender, no distention, no masses, no HSM, no guarding, and no rigidity.   Extremities-- no pretibial edema bilaterally Rectal-- No external abnormalities noted. Normal sphincter tone. No rectal masses or tenderness. no  Stool found Prostate:  Prostate gland firm  and smooth, no enlargement, nodularity, tenderness, mass, asymmetry or induration. Neurologic-- alert & oriented X3 and strength normal in all extremities. Psych-- Cognition and judgment appear intact. Alert and cooperative with normal attention span and concentration.  not anxious appearing and not depressed appearing.       Assessment & Plan:

## 2012-04-07 NOTE — Assessment & Plan Note (Addendum)
Td 2006 Father diagnosed with prostate cancer at age 47, patient's PSA 2003 and was normal, DRE today normal. Plan: PSA every 2 years for now. Grandmother had colon cancer late in life. Mother had colon polyps, first diagnosed at age 20, apparently she had several colonoscopies and she continue getting polyps. We discussed possible colonoscopy, iFOB provided. I recommend to discuss with his mother's GI if he needs a colonoscopy. Labs Continue with his healthy diet, increase exercise

## 2012-04-07 NOTE — Assessment & Plan Note (Signed)
Labs, RF meds

## 2012-04-12 ENCOUNTER — Encounter: Payer: Self-pay | Admitting: Internal Medicine

## 2012-04-16 ENCOUNTER — Encounter: Payer: Self-pay | Admitting: *Deleted

## 2012-04-17 NOTE — Telephone Encounter (Signed)
This encounter was created in error - please disregard.

## 2012-04-19 ENCOUNTER — Ambulatory Visit: Payer: Managed Care, Other (non HMO) | Admitting: Internal Medicine

## 2012-04-25 ENCOUNTER — Telehealth: Payer: Self-pay | Admitting: Internal Medicine

## 2012-04-25 NOTE — Telephone Encounter (Signed)
Advise patient: Needs OV, diabetes not well controlled. If so desire, we can refer him to endocrinology

## 2012-04-27 ENCOUNTER — Other Ambulatory Visit (INDEPENDENT_AMBULATORY_CARE_PROVIDER_SITE_OTHER): Payer: Managed Care, Other (non HMO)

## 2012-04-27 DIAGNOSIS — Z0000 Encounter for general adult medical examination without abnormal findings: Secondary | ICD-10-CM

## 2012-04-28 NOTE — Telephone Encounter (Signed)
Discussed with pt. He states he would rather schedule an OV with Dr. Drue Novel than be referred to an endocrinologist. Pt stated he will have his wife call the office to schedule an appt.

## 2012-04-30 ENCOUNTER — Encounter: Payer: Self-pay | Admitting: *Deleted

## 2012-07-12 ENCOUNTER — Telehealth: Payer: Self-pay | Admitting: Internal Medicine

## 2012-07-12 MED ORDER — METFORMIN HCL 500 MG PO TABS: ORAL_TABLET | ORAL | Status: DC

## 2012-07-12 NOTE — Telephone Encounter (Signed)
Refill done.  

## 2012-07-12 NOTE — Telephone Encounter (Signed)
90 day supply request  Metformin hcl 500mg  tablet.

## 2013-01-04 ENCOUNTER — Telehealth: Payer: Self-pay | Admitting: *Deleted

## 2013-01-04 NOTE — Telephone Encounter (Signed)
Health Examination Certificate completed and placed in provider's folder for  Signature.

## 2013-06-13 ENCOUNTER — Other Ambulatory Visit: Payer: Self-pay | Admitting: Internal Medicine

## 2013-07-05 ENCOUNTER — Telehealth: Payer: Self-pay

## 2013-07-05 NOTE — Telephone Encounter (Signed)
Left message for call back Non identifiable  Tdap--04/2011 PSA--03/2012--0.65

## 2013-07-06 ENCOUNTER — Encounter: Payer: BC Managed Care – PPO | Admitting: Internal Medicine

## 2013-07-06 NOTE — Telephone Encounter (Signed)
Patient Cancelled

## 2013-07-08 ENCOUNTER — Other Ambulatory Visit: Payer: Self-pay | Admitting: *Deleted

## 2013-07-08 MED ORDER — GLUCOSE BLOOD VI STRP: ORAL_STRIP | Status: DC

## 2013-07-08 MED ORDER — ONETOUCH ULTRASOFT LANCETS MISC: Status: DC

## 2013-09-16 ENCOUNTER — Encounter: Payer: Managed Care, Other (non HMO) | Admitting: Internal Medicine

## 2013-09-16 ENCOUNTER — Telehealth: Payer: Self-pay | Admitting: *Deleted

## 2013-09-16 NOTE — Telephone Encounter (Signed)
Left message on voice mail for the patient to return my call regarding upcoming appt.

## 2013-09-19 ENCOUNTER — Encounter: Payer: Self-pay | Admitting: Internal Medicine

## 2013-09-19 ENCOUNTER — Ambulatory Visit (INDEPENDENT_AMBULATORY_CARE_PROVIDER_SITE_OTHER): Payer: BC Managed Care – PPO | Admitting: Internal Medicine

## 2013-09-19 VITALS — BP 129/88 | HR 88 | Temp 97.9°F | Ht 71.5 in | Wt 232.0 lb

## 2013-09-19 DIAGNOSIS — E119 Type 2 diabetes mellitus without complications: Secondary | ICD-10-CM

## 2013-09-19 DIAGNOSIS — Z0000 Encounter for general adult medical examination without abnormal findings: Secondary | ICD-10-CM

## 2013-09-19 MED ORDER — METFORMIN HCL 500 MG PO TABS: ORAL_TABLET | ORAL | Status: DC

## 2013-09-19 NOTE — Progress Notes (Signed)
Pre visit review using our clinic review tool, if applicable. No additional management support is needed unless otherwise documented below in the visit note. 

## 2013-09-19 NOTE — Progress Notes (Signed)
   Subjective:    Patient ID: Gregory Williams, male    DOB: Jul 10, 1965, 48 y.o.   MRN: [id number]  DOS:  09/19/2013 Type of  Visit: CPX History:feeling well  ROS No  CP, SOB No palpitations, no lower extremity edema Denies  nausea, vomiting diarrhea, blood in the stools (-) cough, sputum production (-) wheezing, chest congestion No dysuria, gross hematuria, difficulty urinating  No anxiety, depression    Past Medical History  Diagnosis Date  . Diabetes mellitus 2/09    Past Surgical History  Procedure Laterality Date  . Kidney stone extraction      History   Social History  . Marital Status: Married    Spouse Name: Gregory Williams    Number of Children: 3  . Years of Education: N/A   Occupational History  . [employer] , retirement     Social History Main Topics  . Smoking status: Never Smoker   . Smokeless tobacco: Never Used  . Alcohol Use: Yes     Comment: socially   . Drug Use: No  . Sexual Activity: Yes   Other Topics Concern  . Not on file   Social History Narrative  . No narrative on file     Family History  Problem Relation Age of Onset  . Prostate cancer Father 10  . Diabetes Father   . Parkinsonism Father   . Colon cancer Maternal Grandmother     dx late in life  . Colon polyps Mother     in her 49s  . Hypertension Mother   . Diabetes      father family  . Stroke Neg Hx   . Heart attack Neg Hx   . Diabetes Paternal Aunt       Medication List       This list is accurate as of: 09/19/13 11:59 PM.  Always use your most recent med list.               glucose blood test strip  Commonly known as:  ONE TOUCH ULTRA TEST  TEST AS DIRECTED     metFORMIN 500 MG tablet  Commonly known as:  GLUCOPHAGE  TAKE 1/2 TABLET BY MOUTH TWICE DAILY     onetouch ultrasoft lancets  USE 1 LANCET AS NEEDED/AS INSTRUCTED           Objective:   Physical Exam BP 129/88  Pulse 88  Temp(Src) 97.9 F (36.6 C)  Ht 5' 11.5" (1.816 m)  Wt 232 lb  (105.235 kg)  BMI 31.91 kg/m2  SpO2 97% General -- alert, well-developed, NAD.  Neck --no thyromegaly   HEENT-- Not pale. Lungs -- normal respiratory effort, no intercostal retractions, no accessory muscle use, and normal breath sounds.  Heart-- normal rate, regular rhythm, no murmur.  Abdomen-- Not distended, good bowel sounds,soft, non-tender. Extremities-- no pretibial edema bilaterally  Neurologic--  alert & oriented X3. Speech normal, gait appropriate for age, strength symmetric and appropriate for age.  Psych-- Cognition and judgment appear intact. Cooperative with normal attention span and concentration. No anxious or depressed appearing.       Assessment & Plan:

## 2013-09-19 NOTE — Patient Instructions (Signed)
Please come back fasting: FLP, CBC, CMP, TSH ---dx  A1c microalbumin --- dx diabetes

## 2013-09-19 NOTE — Assessment & Plan Note (Signed)
Td 2006 Father diagnosed with prostate cancer at age 47, patient's PSA   DRE were normal 03-2012---> plan: PSA every 2 years for now. Grandmother had colon cancer late in life. Mother had colon polyps, first diagnosed at age 6, apparently she had several colonoscopies and she continue getting polyps. We discussed possible colonoscopy, will refer to GI Labs Continue with his healthy diet, increase exercise

## 2013-09-19 NOTE — Assessment & Plan Note (Signed)
Since the last time he was here more than a year ago he continue taking metformin. Diet remains healthy, he is not exercising. Ambulatory blood sugars fasting in the morning are usually around 200. He admits to   polydipsia and polyuria. I notice some weight loss . Plan: Check A1c, further advise w/ results

## 2013-09-22 ENCOUNTER — Other Ambulatory Visit (INDEPENDENT_AMBULATORY_CARE_PROVIDER_SITE_OTHER): Payer: BC Managed Care – PPO

## 2013-09-22 DIAGNOSIS — E119 Type 2 diabetes mellitus without complications: Secondary | ICD-10-CM

## 2013-09-22 LAB — CBC WITH DIFFERENTIAL/PLATELET
Basophils Absolute: 0 10*3/uL (ref 0.0–0.1)
Basophils Relative: 0.3 % (ref 0.0–3.0)
EOS PCT: 1.5 % (ref 0.0–5.0)
Eosinophils Absolute: 0.1 10*3/uL (ref 0.0–0.7)
HEMATOCRIT: 47.1 % (ref 39.0–52.0)
Hemoglobin: 16.4 g/dL (ref 13.0–17.0)
LYMPHS ABS: 3.1 10*3/uL (ref 0.7–4.0)
Lymphocytes Relative: 43.8 % (ref 12.0–46.0)
MCHC: 34.9 g/dL (ref 30.0–36.0)
MCV: 89.9 fl (ref 78.0–100.0)
MONOS PCT: 7.5 % (ref 3.0–12.0)
Monocytes Absolute: 0.5 10*3/uL (ref 0.1–1.0)
NEUTROS PCT: 46.9 % (ref 43.0–77.0)
Neutro Abs: 3.3 10*3/uL (ref 1.4–7.7)
Platelets: 217 10*3/uL (ref 150.0–400.0)
RBC: 5.23 Mil/uL (ref 4.22–5.81)
RDW: 12.4 % (ref 11.5–15.5)
WBC: 7 10*3/uL (ref 4.0–10.5)

## 2013-09-22 LAB — COMPREHENSIVE METABOLIC PANEL
ALT: 46 U/L (ref 0–53)
AST: 29 U/L (ref 0–37)
Albumin: 4.4 g/dL (ref 3.5–5.2)
Alkaline Phosphatase: 55 U/L (ref 39–117)
BUN: 14 mg/dL (ref 6–23)
CALCIUM: 9.2 mg/dL (ref 8.4–10.5)
CO2: 26 meq/L (ref 19–32)
Chloride: 99 mEq/L (ref 96–112)
Creatinine, Ser: 0.9 mg/dL (ref 0.4–1.5)
GFR: 99.83 mL/min (ref >60.00)
Glucose, Bld: 271 mg/dL — ABNORMAL HIGH (ref 70–99)
POTASSIUM: 3.6 meq/L (ref 3.5–5.1)
Sodium: 132 mEq/L — ABNORMAL LOW (ref 135–145)
TOTAL PROTEIN: 7.5 g/dL (ref 6.0–8.3)
Total Bilirubin: 1 mg/dL (ref 0.2–1.2)

## 2013-09-22 LAB — MICROALBUMIN / CREATININE URINE RATIO
Creatinine,U: 125.1 mg/dL
Microalb Creat Ratio: 0.6 mg/g (ref 0.0–30.0)
Microalb, Ur: 0.7 mg/dL (ref 0.0–1.9)

## 2013-09-22 LAB — LIPID PANEL
Cholesterol: 206 mg/dL — ABNORMAL HIGH (ref 0–200)
HDL: 39 mg/dL — AB (ref >39.00)
NONHDL: 167
Total CHOL/HDL Ratio: 5
Triglycerides: 642 mg/dL — ABNORMAL HIGH (ref 0.0–149.0)
VLDL: 128.4 mg/dL — ABNORMAL HIGH (ref 0.0–40.0)

## 2013-09-22 LAB — HEMOGLOBIN A1C: Hgb A1c MFr Bld: 9.3 % — ABNORMAL HIGH (ref 4.6–6.5)

## 2013-09-22 LAB — TSH: TSH: 0.57 u[IU]/mL (ref 0.35–4.50)

## 2013-09-28 ENCOUNTER — Telehealth: Payer: Self-pay | Admitting: *Deleted

## 2013-09-28 NOTE — Telephone Encounter (Signed)
[facility] form faxed 09/28/13

## 2013-09-29 ENCOUNTER — Encounter: Payer: Self-pay | Admitting: *Deleted

## 2013-09-29 MED ORDER — SITAGLIPTIN PHOSPHATE 100 MG PO TABS
100.0000 mg | ORAL_TABLET | Freq: Every day | ORAL | Status: DC
Start: 2013-09-29 — End: 2014-05-30

## 2013-09-29 MED ORDER — METFORMIN HCL 1000 MG PO TABS: 1000.0000 mg | ORAL_TABLET | Freq: Two times a day (BID) | ORAL | Status: DC

## 2013-09-29 NOTE — Addendum Note (Signed)
Addended by: Peggyann Shoals on: 09/29/2013 10:34 AM   Modules accepted: Orders

## 2014-01-16 ENCOUNTER — Other Ambulatory Visit (INDEPENDENT_AMBULATORY_CARE_PROVIDER_SITE_OTHER): Payer: BC Managed Care – PPO | Admitting: *Deleted

## 2014-01-16 DIAGNOSIS — E119 Type 2 diabetes mellitus without complications: Secondary | ICD-10-CM

## 2014-01-16 DIAGNOSIS — Z23 Encounter for immunization: Secondary | ICD-10-CM

## 2014-01-19 ENCOUNTER — Encounter: Payer: Self-pay | Admitting: Gastroenterology

## 2014-01-22 ENCOUNTER — Telehealth: Payer: Self-pay | Admitting: Internal Medicine

## 2014-01-22 NOTE — Telephone Encounter (Signed)
Advice patient, he is over due for office visit, please arrange at the earliest patient's convenience

## 2014-01-23 NOTE — Telephone Encounter (Signed)
Letter printed and mailed to Pt informing him that he is due for OV.

## 2014-03-28 ENCOUNTER — Ambulatory Visit: Payer: BC Managed Care – PPO | Admitting: Gastroenterology

## 2014-03-28 NOTE — Progress Notes (Signed)
Patient ID: Gregory Williams, male   DOB: [birth year], 49 y.o.   MRN: [id number] Patient no-showed today's appointment; provider notified for review of record.     Please send a no-show letter to the patient recommending that they reschedule the appointment.   Also send a letter to the referring provider alerting them of the NO SHOW.

## 2014-05-08 ENCOUNTER — Ambulatory Visit: Payer: Self-pay | Admitting: Internal Medicine

## 2014-05-30 ENCOUNTER — Ambulatory Visit (INDEPENDENT_AMBULATORY_CARE_PROVIDER_SITE_OTHER): Payer: BLUE CROSS/BLUE SHIELD | Admitting: Gastroenterology

## 2014-05-30 ENCOUNTER — Encounter: Payer: Self-pay | Admitting: Gastroenterology

## 2014-05-30 VITALS — BP 142/76 | HR 64 | Ht 71.65 in | Wt 230.2 lb

## 2014-05-30 DIAGNOSIS — Z800 Family history of malignant neoplasm of digestive organs: Secondary | ICD-10-CM

## 2014-05-30 NOTE — [facility] Notes (Signed)
HPI: This is a  49 year old man    who was referred to me by Colon Branch, MD  to evaluate  Colon cancer screening given his FH of colon cancer  His maternal GM had colon cancer late in her life, diagnosed in her 65s  His mother has has numerous polyps removed from her colon.  His bowels are  Fine.  No bleeding or constipation.  He has been perfectly regular.  Overall he's been trying to lose weight.  About 4 pounds per month.   Review of systems: Pertinent positive and negative review of systems were noted in the above HPI section. Complete review of systems was performed and was otherwise normal.    Past Medical History  Diagnosis Date  . Diabetes mellitus 2/09    Past Surgical History  Procedure Laterality Date  . Kidney stone extraction      Current Outpatient Prescriptions  Medication Sig Dispense Refill  . glucose blood (ONE TOUCH ULTRA TEST) test strip TEST AS DIRECTED 100 each 4  . Lancets (ONETOUCH ULTRASOFT) lancets USE 1 LANCET AS NEEDED/AS INSTRUCTED 100 each 4  . metFORMIN (GLUCOPHAGE) 1000 MG tablet Take 1 tablet (1,000 mg total) by mouth 2 (two) times daily with a meal. 180 tablet 1   No current facility-administered medications for this visit.    Allergies as of 05/30/2014  . (No Known Allergies)    Family History  Problem Relation Age of Onset  . Prostate cancer Father 51  . Diabetes Father   . Parkinsonism Father   . Colon cancer Maternal Grandmother     dx late in life  . Colon polyps Mother     in her 71s  . Hypertension Mother   . Diabetes      father family  . Stroke Neg Hx   . Heart attack Neg Hx   . Diabetes Paternal Aunt     History   Social History  . Marital Status: Married    Spouse Name: Pamala Hurry  . Number of Children: 3  . Years of Education: N/A   Occupational History  . [employer] , retirement     Social History Main Topics  . Smoking status: Never Smoker   . Smokeless tobacco: Never Used  . Alcohol Use: Yes    Comment: socially   . Drug Use: No  . Sexual Activity: Yes   Other Topics Concern  . Not on file   Social History Narrative       Physical Exam: Ht 5' 11.65" (1.82 m)  Wt 230 lb 3.2 oz (104.418 kg)  BMI 31.52 kg/m2 Constitutional: generally well-appearing Psychiatric: alert and oriented x3 Eyes: extraocular movements intact Mouth: oral pharynx moist, no lesions Neck: supple no lymphadenopathy Cardiovascular: heart regular rate and rhythm Lungs: clear to auscultation bilaterally Abdomen: soft, nontender, nondistended, no obvious ascites, no peritoneal signs, normal bowel sounds Extremities: no lower extremity edema bilaterally Skin: no lesions on visible extremities   Assessment and plan: 49 y.o. male with  slightly elevated risk for colon cancer  his mother has had numerous colonoscopies with colon polyps detected. His grandmother had colon cancer. This probably puts him at slightly increased risk for colon cancer himself we will proceed with colonoscopy screening at his soonest convenience. If he has no polyps or cancers I would put him back to routine risk pool and would recommend repeat colon cancer screening in 10 years.      Cc: Colon Branch, MD

## 2014-05-30 NOTE — Patient Instructions (Signed)
You will be set up for a colonoscopy for FH of colon cancer, colon polyps.

## 2014-05-31 ENCOUNTER — Ambulatory Visit (INDEPENDENT_AMBULATORY_CARE_PROVIDER_SITE_OTHER): Payer: BLUE CROSS/BLUE SHIELD | Admitting: Internal Medicine

## 2014-05-31 ENCOUNTER — Encounter: Payer: Self-pay | Admitting: Internal Medicine

## 2014-05-31 VITALS — BP 128/86 | HR 61 | Temp 98.0°F | Ht 72.0 in | Wt 228.2 lb

## 2014-05-31 DIAGNOSIS — E119 Type 2 diabetes mellitus without complications: Secondary | ICD-10-CM

## 2014-05-31 DIAGNOSIS — M6208 Separation of muscle (nontraumatic), other site: Secondary | ICD-10-CM | POA: Insufficient documentation

## 2014-05-31 DIAGNOSIS — E118 Type 2 diabetes mellitus with unspecified complications: Secondary | ICD-10-CM

## 2014-05-31 LAB — BASIC METABOLIC PANEL
BUN: 12 mg/dL (ref 6–23)
CO2: 25 mEq/L (ref 19–32)
CREATININE: 0.84 mg/dL (ref 0.40–1.50)
Calcium: 9.4 mg/dL (ref 8.4–10.5)
Chloride: 101 mEq/L (ref 96–112)
GFR: 103.66 mL/min (ref >60.00)
Glucose, Bld: 184 mg/dL — ABNORMAL HIGH (ref 70–99)
Potassium: 3.8 mEq/L (ref 3.5–5.1)
Sodium: 134 mEq/L — ABNORMAL LOW (ref 135–145)

## 2014-05-31 LAB — AST: AST: 21 U/L (ref 0–37)

## 2014-05-31 LAB — HEMOGLOBIN A1C: HEMOGLOBIN A1C: 7.8 % — AB (ref 4.6–6.5)

## 2014-05-31 LAB — ALT: ALT: 35 U/L (ref 0–53)

## 2014-05-31 MED ORDER — METFORMIN HCL 1000 MG PO TABS: 1000.0000 mg | ORAL_TABLET | Freq: Two times a day (BID) | ORAL | Status: DC

## 2014-05-31 NOTE — Progress Notes (Signed)
Pre visit review using our clinic review tool, if applicable. No additional management support is needed unless otherwise documented below in the visit note. 

## 2014-05-31 NOTE — Patient Instructions (Signed)
Get your blood work before you leave    Come back to the office in 3-4 months  for a routine check up   Please schedule an appointment at the front desk    Come back fasting     Diabetes: Check your blood sugar  once a day   3-4 times a week  Check your blood sugar  at different times of the day  GOALS: Fasting before a meal 70- 130 2 hours after a meal less than 180 At bedtime 90-150 Call if consistently not at goal ---- Remember that you need your eyes checked at least once a year to be sure you don't have "retinopathy" Check your feet regularly

## 2014-05-31 NOTE — Progress Notes (Signed)
   Subjective:    Patient ID: Gregory Williams, male    DOB: 12-01-1965, 49 y.o.   MRN: [id number]  DOS:  05/31/2014 Type of visit - description : f/u Interval history: Here for diabetes follow-up, decided to take only one metformin daily, mostly because he forgets the second dose. Not taking any other diabetes medication. In the last 4 months, has changed his diet, exercising more, has lost 1 pound a month. Feels well.    Review of Systems Denies chest pain or difficulty breathing. No nausea, vomiting, diarrhea. Has noticed a lump on the midline of the abdomen, hernia?  Past Medical History  Diagnosis Date  . Diabetes mellitus 2/09    Past Surgical History  Procedure Laterality Date  . Kidney stone extraction      History   Social History  . Marital Status: Married    Spouse Name: Pamala Hurry  . Number of Children: 3  . Years of Education: N/A   Occupational History  . [employer]    Social History Main Topics  . Smoking status: Never Smoker   . Smokeless tobacco: Never Used  . Alcohol Use: Yes     Comment: socially   . Drug Use: No  . Sexual Activity: Yes   Other Topics Concern  . Not on file   Social History Narrative        Medication List       This list is accurate as of: 05/31/14 11:59 PM.  Always use your most recent med list.               glucose blood test strip  Commonly known as:  ONE TOUCH ULTRA TEST  TEST AS DIRECTED     metFORMIN 1000 MG tablet  Commonly known as:  GLUCOPHAGE  Take 1 tablet (1,000 mg total) by mouth 2 (two) times daily with a meal.     onetouch ultrasoft lancets  USE 1 LANCET AS NEEDED/AS INSTRUCTED           Objective:   Physical Exam BP 128/86 mmHg  Pulse 61  Temp(Src) 98 F (36.7 C) (Oral)  Ht 6' (1.829 m)  Wt 228 lb 4 oz (103.534 kg)  BMI 30.95 kg/m2  SpO2 97% General:   Well developed, well nourished . NAD.  HEENT:  Normocephalic . Face symmetric, atraumatic  Abdomen:  Not distended, soft,  non-tender. Midline, supraumbilical mass c/w rectum abdominis diastasis Muscle skeletal: no pretibial edema bilaterally  Skin: Not pale. Not jaundice Neurologic:  alert & oriented X3.  Speech normal, gait appropriate for age and unassisted Psych--  Cognition and judgment appear intact.  Cooperative with normal attention span and concentration.  Behavior appropriate. No anxious or depressed appearing.       Assessment & Plan:

## 2014-05-31 NOTE — Assessment & Plan Note (Signed)
Findings consistent with rectus diastasis, recommend to continue lose weight, if he ever has pain in the area needs to be seen immediately.

## 2014-05-31 NOTE — Assessment & Plan Note (Addendum)
Poorly controlled diabetes, last A1c > 9. Currently taking metformin once daily, in the last 4 months has changed his diet and is doing better. Patient is counseled about the concept of diabetes legacy. We'll check a BMP, A1c, AST ALT. We agreed he will take metformin twice a day and will strongly consider a second medication in the line of Januvia or Onglyza. Patient in agreement, follow-up 3 months.

## 2014-06-09 ENCOUNTER — Ambulatory Visit (AMBULATORY_SURGERY_CENTER): Payer: BLUE CROSS/BLUE SHIELD | Admitting: Gastroenterology

## 2014-06-09 ENCOUNTER — Encounter: Payer: Self-pay | Admitting: Gastroenterology

## 2014-06-09 VITALS — BP 136/73 | HR 62 | Temp 96.8°F | Resp 19 | Ht 71.0 in | Wt 230.0 lb

## 2014-06-09 DIAGNOSIS — Z800 Family history of malignant neoplasm of digestive organs: Secondary | ICD-10-CM | POA: Diagnosis not present

## 2014-06-09 DIAGNOSIS — Z1211 Encounter for screening for malignant neoplasm of colon: Secondary | ICD-10-CM | POA: Diagnosis not present

## 2014-06-09 DIAGNOSIS — D125 Benign neoplasm of sigmoid colon: Secondary | ICD-10-CM

## 2014-06-09 DIAGNOSIS — Z8371 Family history of colonic polyps: Secondary | ICD-10-CM | POA: Diagnosis not present

## 2014-06-09 MED ORDER — SODIUM CHLORIDE 0.9 % IV SOLN: 500.0000 mL | INTRAVENOUS | Status: DC

## 2014-06-09 NOTE — Op Note (Signed)
[facility]  [address]. [place], [address]   COLONOSCOPY PROCEDURE REPORT  PATIENT: Gregory Williams, Gregory Williams  MR#: [id number] BIRTHDATE: 01-04-1966 , 49  yrs. old GENDER: male ENDOSCOPIST: Milus Banister, MD REFERRED QP:RFFM Larose Kells, M.D. PROCEDURE DATE:  06/09/2014 PROCEDURE:   Colonoscopy, screening, Colonoscopy with snare polypectomy, and Submucosal injection, any substance First Screening Colonoscopy - Avg.  risk and is 50 yrs.  old or older Yes.  Prior Negative Screening - Now for repeat screening. N/A  History of Adenoma - Now for follow-up colonoscopy & has been > or = to 3 yrs.  N/A ASA CLASS:   Class II INDICATIONS:Screening for colonic neoplasia and FH Colon Adenoma (mother with many colon polyps, maternal GM with CRC). MEDICATIONS: Monitored anesthesia care and Propofol 200 mg IV  DESCRIPTION OF PROCEDURE:   After the risks benefits and alternatives of the procedure were thoroughly explained, informed consent was obtained.  The digital rectal exam revealed no abnormalities of the rectum.   The LB [id number]  endoscope was introduced through the anus and advanced to the cecum, which was identified by both the appendix and ileocecal valve. No adverse events experienced.   The quality of the prep was excellent.  The instrument was then slowly withdrawn as the colon was fully examined.  COLON FINDINGS: A pedunculated polyp measuring 30 mm in size was found in the sigmoid colon.  A polypectomy was performed using snare cautery.  The resection was complete, the polyp tissue was completely retrieved and sent to histology.   A pedunculated polyp measuring 15 mm in size was found in the sigmoid colon.  A polypectomy was performed using snare cautery. These two polyps were within 2cm of eachother. The mucosa between the polyps was labled with submucosal injection of SPOT following polyp removal. The examination was otherwise normal.  Retroflexed views  revealed no abnormalities. The time to cecum = 2.0 Withdrawal time = 13.7 The scope was withdrawn and the procedure completed. COMPLICATIONS: There were no immediate complications.  ENDOSCOPIC IMPRESSION: 1.   Pedunculated polyp (3cm) was found in the sigmoid colon; polypectomy was performed using snare cautery 2.   Pedunculated polyp (1.5cm) was found in the sigmoid colon; polypectomy was performed using snare cautery 3.   The examination was otherwise normal  RECOMMENDATIONS: If the polyp(s) removed today are proven to be adenomatous (pre-cancerous) polyps, you will need a colonoscopy in 3 years. Otherwise you should continue to follow colorectal cancer screening guidelines for "routine risk" patients with a colonoscopy in 10 years.  You will receive a letter within 1-2 weeks with the results of your biopsy as well as final recommendations.  Please call my office if you have not received a letter after 3 weeks.  eSigned:  Milus Banister, MD 06/09/2014 2:16 PM

## 2014-06-09 NOTE — [facility] Notes (Signed)
Called to room to assist during endoscopic procedure.  Patient ID and intended procedure confirmed with present staff. Received instructions for my participation in the procedure from the performing physician.  

## 2014-06-09 NOTE — [facility] Notes (Signed)
Report to PACU, RN, vss, BBS= Clear.  

## 2014-06-09 NOTE — Patient Instructions (Addendum)
YOU HAD AN ENDOSCOPIC PROCEDURE TODAY AT [facility]:   Refer to the procedure report that was given to you for any specific questions about what was found during the examination.  If the procedure report does not answer your questions, please call your gastroenterologist to clarify.  If you requested that your care partner not be given the details of your procedure findings, then the procedure report has been included in a sealed envelope for you to review at your convenience later.  YOU SHOULD EXPECT: Some feelings of bloating in the abdomen. Passage of more gas than usual.  Walking can help get rid of the air that was put into your GI tract during the procedure and reduce the bloating. If you had a lower endoscopy (such as a colonoscopy or flexible sigmoidoscopy) you may notice spotting of blood in your stool or on the toilet paper. If you underwent a bowel prep for your procedure, you may not have a normal bowel movement for a few days.  Please Note:  You might notice some irritation and congestion in your nose or some drainage.  This is from the oxygen used during your procedure.  There is no need for concern and it should clear up in a day or so.  SYMPTOMS TO REPORT IMMEDIATELY:   Following lower endoscopy (colonoscopy or flexible sigmoidoscopy):  Excessive amounts of blood in the stool  Significant tenderness or worsening of abdominal pains  Swelling of the abdomen that is new, acute  Fever of 100F or higher  For urgent or emergent issues, a gastroenterologist can be reached at any hour by calling [phone].  DIET: Your first meal following the procedure should be a small meal and then it is ok to progress to your normal diet. Heavy or fried foods are harder to digest and may make you feel nauseous or bloated.  Likewise, meals heavy in dairy and vegetables can increase bloating.  Drink plenty of fluids but you should avoid alcoholic beverages for 24 hours.  ACTIVITY:   You should plan to take it easy for the rest of today and you should NOT DRIVE or use heavy machinery until tomorrow (because of the sedation medicines used during the test).    FOLLOW UP: Our staff will call the number listed on your records the next business day following your procedure to check on you and address any questions or concerns that you may have regarding the information given to you following your procedure. If we do not reach you, we will leave a message.  However, if you are feeling well and you are not experiencing any problems, there is no need to return our call.  We will assume that you have returned to your regular daily activities without incident.  If any biopsies were taken you will be contacted by phone or by letter within the next 1-3 weeks.  Please call us at [phone] if you have not heard about the biopsies in 3 weeks.   SIGNATURES/CONFIDENTIALITY: You and/or your care partner have signed paperwork which will be entered into your electronic medical record.  These signatures attest to the fact that that the information above on your After Visit Summary has been reviewed and is understood.  Full responsibility of the confidentiality of this discharge information lies with you and/or your care-partner.  Continue your normal medications  No Aspirin, Aspirin containing products (BC or Goody powders) or NSAIDS (Advil, Ibuprofen, Motrin, Aleve) for 2 weeks- Tylenol is ok  Please read over handout about polyps

## 2014-06-12 ENCOUNTER — Telehealth: Payer: Self-pay | Admitting: *Deleted

## 2014-06-12 NOTE — Telephone Encounter (Signed)
  Follow up Call-  Call back number 06/09/2014  Post procedure Call Back phone  # [phone]  Permission to leave phone message Yes     No answer, left message.

## 2014-06-28 ENCOUNTER — Encounter: Payer: Self-pay | Admitting: Gastroenterology

## 2014-07-24 ENCOUNTER — Encounter: Payer: Self-pay | Admitting: Gastroenterology

## 2014-07-24 ENCOUNTER — Other Ambulatory Visit: Payer: Self-pay | Admitting: Internal Medicine

## 2014-09-12 ENCOUNTER — Ambulatory Visit (AMBULATORY_SURGERY_CENTER): Payer: Self-pay

## 2014-09-12 VITALS — Ht 72.0 in | Wt 231.8 lb

## 2014-09-12 DIAGNOSIS — Z860100 Personal history of colon polyps, unspecified: Secondary | ICD-10-CM

## 2014-09-12 NOTE — [facility] Notes (Signed)
No allergies to eggs or soy No home oxygen No diet/weight loss meds No past problems with anesthesia  Has email  Emmi instructions given for flex sig

## 2014-09-29 ENCOUNTER — Encounter: Payer: Self-pay | Admitting: Gastroenterology

## 2014-09-29 ENCOUNTER — Ambulatory Visit (AMBULATORY_SURGERY_CENTER): Payer: BLUE CROSS/BLUE SHIELD | Admitting: Gastroenterology

## 2014-09-29 VITALS — BP 120/72 | HR 67 | Temp 97.0°F | Resp 18 | Ht 72.0 in | Wt 231.0 lb

## 2014-09-29 DIAGNOSIS — Z86010 Personal history of colonic polyps: Secondary | ICD-10-CM | POA: Diagnosis present

## 2014-09-29 MED ORDER — SODIUM CHLORIDE 0.9 % IV SOLN: 500.0000 mL | INTRAVENOUS | Status: DC

## 2014-09-29 NOTE — [facility] Notes (Signed)
A/ox3 pleased with MAC, report to tracy W RN

## 2014-09-29 NOTE — Patient Instructions (Signed)
See Procedure report for findings and recommendations  YOU HAD AN ENDOSCOPIC PROCEDURE TODAY AT THE [facility]:   Refer to the procedure report that was given to you for any specific questions about what was found during the examination.  If the procedure report does not answer your questions, please call your gastroenterologist to clarify.  If you requested that your care partner not be given the details of your procedure findings, then the procedure report has been included in a sealed envelope for you to review at your convenience later.  YOU SHOULD EXPECT: Some feelings of bloating in the abdomen. Passage of more gas than usual.  Walking can help get rid of the air that was put into your GI tract during the procedure and reduce the bloating. If you had a lower endoscopy (such as a colonoscopy or flexible sigmoidoscopy) you may notice spotting of blood in your stool or on the toilet paper. If you underwent a bowel prep for your procedure, you may not have a normal bowel movement for a few days.  Please Note:  You might notice some irritation and congestion in your nose or some drainage.  This is from the oxygen used during your procedure.  There is no need for concern and it should clear up in a day or so.  SYMPTOMS TO REPORT IMMEDIATELY:   Following lower endoscopy (colonoscopy or flexible sigmoidoscopy):  Excessive amounts of blood in the stool  Significant tenderness or worsening of abdominal pains  Swelling of the abdomen that is new, acute  Fever of 100F or higher   Following upper endoscopy (EGD)  Vomiting of blood or coffee ground material  New chest pain or pain under the shoulder blades  Painful or persistently difficult swallowing  New shortness of breath  Fever of 100F or higher  Black, tarry-looking stools  For urgent or emergent issues, a gastroenterologist can be reached at any hour by calling [phone].   DIET: Your first meal following the  procedure should be a small meal and then it is ok to progress to your normal diet. Heavy or fried foods are harder to digest and may make you feel nauseous or bloated.  Likewise, meals heavy in dairy and vegetables can increase bloating.  Drink plenty of fluids but you should avoid alcoholic beverages for 24 hours.  ACTIVITY:  You should plan to take it easy for the rest of today and you should NOT DRIVE or use heavy machinery until tomorrow (because of the sedation medicines used during the test).    FOLLOW UP: Our staff will call the number listed on your records the next business day following your procedure to check on you and address any questions or concerns that you may have regarding the information given to you following your procedure. If we do not reach you, we will leave a message.  However, if you are feeling well and you are not experiencing any problems, there is no need to return our call.  We will assume that you have returned to your regular daily activities without incident.  If any biopsies were taken you will be contacted by phone or by letter within the next 1-3 weeks.  Please call us at [phone] if you have not heard about the biopsies in 3 weeks.    SIGNATURES/CONFIDENTIALITY: You and/or your care partner have signed paperwork which will be entered into your electronic medical record.  These signatures attest to the fact that that the information above   on your After Visit Summary has been reviewed and is understood.  Full responsibility of the confidentiality of this discharge information lies with you and/or your care-partner.  Please follow all discharge instructions given to you by the recovery room nurse. If you have any questions or problems after discharge please call one of the numbers listed above. You will receive a phone call in the am to see how you are doing and answer any questions you may have. Thank you for choosing [facility] for your health  care needs. 

## 2014-09-29 NOTE — Op Note (Signed)
[facility]  [address]. [place], [address]   FLEX SIGMOIDOSCOPY PROCEDURE REPORT  PATIENT: Gregory Williams, Gregory Williams  MR#: [id number] BIRTHDATE: 18-Aug-1965 , 49  yrs. old GENDER: male ENDOSCOPIST: Milus Banister, MD PROCEDURE DATE:  09/29/2014 PROCEDURE:   Sigmoidoscopy, surveillance INDICATIONS:Colonoscopy Dr.  Ardis Hughs 05/2014 found 2 pedunculated sigmoid polyps (3cm and 1.5cm).  Pathology from one of the polyps suggested adenomatous tissue at cautery margin.Marland Kitchen MEDICATIONS: Monitored anesthesia care and Propofol 120 mg IV  DESCRIPTION OF PROCEDURE:    Physical exam was performed.  Informed consent was obtained from the patient after explaining the benefits, risks, and alternatives to procedure.  The patient was connected to monitor and placed in left lateral position. Continuous oxygen was provided by nasal cannula and IV medicine administered through an indwelling cannula.  After administration of sedation and rectal exam, the patients rectum was intubated and the LB [id number]  colonoscope was advanced under direct visualization to the cecum.  The scope was removed slowly by carefully examining the color, texture, anatomy, and integrity mucosa on the way out.  The patient was recovered in endoscopy and discharged home in satisfactory condition. Estimated blood loss is zero unless otherwise noted in this procedure report.    COLON FINDINGS: The site of recent sigmoid polypectomies, submucosal tatoos was easily located and there was no apparent remaining polypoid tissue.  PREP QUALITY: The overall prep quality was good.  COMPLICATIONS: None  ENDOSCOPIC IMPRESSION: The site of recent sigmoid polypectomies, submucosal tatoos was easily located and there was no apparent remaining polypoid tissue  RECOMMENDATIONS: Repeat colonoscopy in 3 years.     _______________________________ eSignedMilus Banister, MD 09/29/2014 9:17 AM

## 2014-10-02 ENCOUNTER — Telehealth: Payer: Self-pay | Admitting: *Deleted

## 2014-10-02 NOTE — Telephone Encounter (Signed)
  Follow up Call-  Call back number 09/29/2014 06/09/2014  Post procedure Call Back phone  # wife's [phone]  Permission to leave phone message Yes Yes     Patient questions:  Do you have a fever, pain , or abdominal swelling? No. Pain Score  0 *  Have you tolerated food without any problems? Yes.    Have you been able to return to your normal activities? Yes.    Do you have any questions about your discharge instructions: Diet   No. Medications  No. Follow up visit  No.  Do you have questions or concerns about your Care? No.  Actions: * If pain score is 4 or above: No action needed, pain <4.  Spoke with wife, "he's fine" per wife. No questions at this time.

## 2015-02-14 ENCOUNTER — Telehealth: Payer: Self-pay

## 2015-02-14 NOTE — Telephone Encounter (Signed)
Left message for return call (Pre-Visit information)

## 2015-02-19 ENCOUNTER — Encounter: Payer: Self-pay | Admitting: Internal Medicine

## 2015-02-19 ENCOUNTER — Ambulatory Visit (INDEPENDENT_AMBULATORY_CARE_PROVIDER_SITE_OTHER): Payer: BLUE CROSS/BLUE SHIELD | Admitting: Internal Medicine

## 2015-02-19 VITALS — BP 124/78 | HR 70 | Temp 97.8°F | Ht 72.0 in | Wt 229.4 lb

## 2015-02-19 DIAGNOSIS — Z0000 Encounter for general adult medical examination without abnormal findings: Secondary | ICD-10-CM

## 2015-02-19 DIAGNOSIS — E119 Type 2 diabetes mellitus without complications: Secondary | ICD-10-CM

## 2015-02-19 DIAGNOSIS — Z23 Encounter for immunization: Secondary | ICD-10-CM

## 2015-02-19 DIAGNOSIS — Z125 Encounter for screening for malignant neoplasm of prostate: Secondary | ICD-10-CM | POA: Diagnosis not present

## 2015-02-19 DIAGNOSIS — Z114 Encounter for screening for human immunodeficiency virus [HIV]: Secondary | ICD-10-CM

## 2015-02-19 DIAGNOSIS — Z09 Encounter for follow-up examination after completed treatment for conditions other than malignant neoplasm: Secondary | ICD-10-CM | POA: Insufficient documentation

## 2015-02-19 LAB — COMPREHENSIVE METABOLIC PANEL
ALT: 45 U/L (ref 0–53)
AST: 25 U/L (ref 0–37)
Albumin: 4.4 g/dL (ref 3.5–5.2)
Alkaline Phosphatase: 50 U/L (ref 39–117)
BILIRUBIN TOTAL: 1 mg/dL (ref 0.2–1.2)
BUN: 10 mg/dL (ref 6–23)
CO2: 26 meq/L (ref 19–32)
CREATININE: 0.86 mg/dL (ref 0.40–1.50)
Calcium: 9.6 mg/dL (ref 8.4–10.5)
Chloride: 100 mEq/L (ref 96–112)
GFR: 100.57 mL/min (ref >60.00)
GLUCOSE: 221 mg/dL — AB (ref 70–99)
Potassium: 4.1 mEq/L (ref 3.5–5.1)
SODIUM: 135 meq/L (ref 135–145)
Total Protein: 7.1 g/dL (ref 6.0–8.3)

## 2015-02-19 LAB — CBC WITH DIFFERENTIAL/PLATELET
BASOS ABS: 0 10*3/uL (ref 0.0–0.1)
Basophils Relative: 0.4 % (ref 0.0–3.0)
EOS PCT: 1.3 % (ref 0.0–5.0)
Eosinophils Absolute: 0.1 10*3/uL (ref 0.0–0.7)
HCT: 48.6 % (ref 39.0–52.0)
HEMOGLOBIN: 16.6 g/dL (ref 13.0–17.0)
Lymphocytes Relative: 41.5 % (ref 12.0–46.0)
Lymphs Abs: 2.5 10*3/uL (ref 0.7–4.0)
MCHC: 34.1 g/dL (ref 30.0–36.0)
MCV: 89.7 fl (ref 78.0–100.0)
MONOS PCT: 6.2 % (ref 3.0–12.0)
Monocytes Absolute: 0.4 10*3/uL (ref 0.1–1.0)
NEUTROS PCT: 50.6 % (ref 43.0–77.0)
Neutro Abs: 3 10*3/uL (ref 1.4–7.7)
Platelets: 207 10*3/uL (ref 150.0–400.0)
RBC: 5.42 Mil/uL (ref 4.22–5.81)
RDW: 12.8 % (ref 11.5–15.5)
WBC: 6 10*3/uL (ref 4.0–10.5)

## 2015-02-19 LAB — LIPID PANEL
CHOL/HDL RATIO: 5
Cholesterol: 192 mg/dL (ref 0–200)
HDL: 41.2 mg/dL (ref >39.00)
NONHDL: 151.27
Triglycerides: 259 mg/dL — ABNORMAL HIGH (ref 0.0–149.0)
VLDL: 51.8 mg/dL — ABNORMAL HIGH (ref 0.0–40.0)

## 2015-02-19 LAB — PSA: PSA: 0.72 ng/mL (ref 0.10–4.00)

## 2015-02-19 LAB — HEMOGLOBIN A1C: Hgb A1c MFr Bld: 8.5 % — ABNORMAL HIGH (ref 4.6–6.5)

## 2015-02-19 LAB — MICROALBUMIN / CREATININE URINE RATIO
CREATININE, U: 210.3 mg/dL
MICROALB/CREAT RATIO: 0.9 mg/g (ref 0.0–30.0)
Microalb, Ur: 1.8 mg/dL (ref 0.0–1.9)

## 2015-02-19 LAB — LDL CHOLESTEROL, DIRECT: LDL DIRECT: 115 mg/dL

## 2015-02-19 MED ORDER — ONETOUCH ULTRASOFT LANCETS MISC: Status: DC

## 2015-02-19 MED ORDER — ONETOUCH ULTRA BLUE VI STRP: ORAL_STRIP | Status: DC

## 2015-02-19 NOTE — Patient Instructions (Signed)
Get your blood work before you leave     Next visit  for a  Routine check up in 4 months     (15 minutes) Please schedule an appointment at the front desk

## 2015-02-19 NOTE — Assessment & Plan Note (Signed)
DM- poorly controlled , DM legacy concept discussed, reluctant to take more meds, won't be able to improve lifestyle thus will rec meds if not at goal Foot exam neg today, plans to see the eye doctor  Dyslipidemia-- labs  RTC 4 months

## 2015-02-19 NOTE — Assessment & Plan Note (Addendum)
Td 2006 prevnar and  flu shot today. Father diagnosed with prostate cancer at age 48,     DRE today normal , check a PSA  FH colon ca- GM FH colon polyps- mother  Cscope 05-2014, flex sig 09-2014 ok, next cscope 2019 Labs: cMP, FLP, CBC, A1c, micro, PSA, HIV  Lifestyle discussed

## 2015-02-19 NOTE — Progress Notes (Signed)
Subjective:    Patient ID: Gregory Williams., male    DOB: 1965-12-05, 49 y.o.   MRN: [id number]  DOS:  02/19/2015 Type of visit - description : cPX, here with his wife Interval history: Very busy at work and with his family, doing the best he can with diet and exercise. CBGs ranged from 180-200   Review of Systems  Constitutional: No fever. No chills. No unexplained wt changes. No unusual sweats  HEENT: No dental problems, no ear discharge, no facial swelling, no voice changes. No eye discharge, no eye  redness , no  intolerance to light   Respiratory: No wheezing , no  difficulty breathing. No cough , no mucus production  Cardiovascular: No CP, no leg swelling , no  Palpitations  GI: no nausea, no vomiting, no diarrhea , no  abdominal pain.  No blood in the stools. No dysphagia, no odynophagia    Endocrine: No polyphagia, no polyuria , no polydipsia  GU: No dysuria, gross hematuria, difficulty urinating. No urinary urgency, no frequency.  Musculoskeletal: No joint swellings or unusual aches or pains  Skin: No change in the color of the skin, palor , no  Rash  Allergic, immunologic: No environmental allergies , no  food allergies  Neurological: No dizziness no  syncope. No headaches. No diplopia, no slurred, no slurred speech, no motor deficits, no facial  Numbness  Hematological: No enlarged lymph nodes, no easy bruising , no unusual bleedings  Psychiatry: No suicidal ideas, no hallucinations, no beavior problems, no confusion.  No unusual/severe anxiety, no depression     Past Medical History  Diagnosis Date  . Diabetes mellitus 2/09    Past Surgical History  Procedure Laterality Date  . Kidney stone extraction      Social History   Social History  . Marital Status: Married    Spouse Name: Pamala Hurry  . Number of Children: 3  . Years of Education: N/A   Occupational History  . [employer]    Social History Main Topics  . Smoking status: Never  Smoker   . Smokeless tobacco: Never Used  . Alcohol Use: 0.0 oz/week    0 Standard drinks or equivalent per week     Comment: socially ; just a few times yearly  . Drug Use: No  . Sexual Activity: Yes   Other Topics Concern  . Not on file   Social History Narrative     Family History  Problem Relation Age of Onset  . Prostate cancer Father 24  . Diabetes Father   . Parkinsonism Father   . Colon cancer Maternal Grandmother     dx late in life  . Colon polyps Mother     in her 65s  . Hypertension Mother   . Diabetes      father family  . Stroke Neg Hx   . Heart attack Neg Hx   . Esophageal cancer Neg Hx   . Rectal cancer Neg Hx   . Stomach cancer Neg Hx   . Diabetes Paternal Aunt       Medication List       This list is accurate as of: 02/19/15  7:08 PM.  Always use your most recent med list.               metFORMIN 1000 MG tablet  Commonly known as:  GLUCOPHAGE  Take 1 tablet (1,000 mg total) by mouth 2 (two) times daily with a meal.  ONE TOUCH ULTRA TEST test strip  Generic drug:  glucose blood  Check blood sugar no more than twice daily.     onetouch ultrasoft lancets  Check blood sugar no more than twice daily.           Objective:   Physical Exam BP 124/78 mmHg  Pulse 70  Temp(Src) 97.8 F (36.6 C) (Oral)  Ht 6' (1.829 m)  Wt 229 lb 6 oz (104.044 kg)  BMI 31.10 kg/m2  SpO2 97% General:   Well developed, well nourished . NAD.  Neck:   No  thyromegaly , normal carotid pulse HEENT:  Normocephalic . Face symmetric, atraumatic Lungs:  CTA B Normal respiratory effort, no intercostal retractions, no accessory muscle use. Heart: RRR,  no murmur.  No pretibial edema bilaterally  Abdomen:  Not distended, soft, non-tender. No rebound or rigidity.  Rectal:  External abnormalities: none. Normal sphincter tone. No rectal masses or tenderness.  No stools   Prostate: Prostate gland firm and smooth, no enlargement, nodularity, tenderness,  mass, asymmetry or induration.  DM : foot exam-- normal pulses, skin and pinprick exam  Skin: Exposed areas without rash. Not pale. Not jaundice Neurologic:  alert & oriented X3.  Speech normal, gait appropriate for age and unassisted Strength symmetric and appropriate for age.  Psych: Cognition and judgment appear intact.  Cooperative with normal attention span and concentration.  Behavior appropriate. No anxious or depressed appearing.     Assessment & Plan:   Assessment> DM 2009 Dyslipidemia elevated TG Kidney stones Rectus diastasis  PLAN: DM- poorly controlled , DM legacy concept discussed, reluctant to take more meds, won't be able to improve lifestyle thus will rec meds if not at goal Foot exam neg today, plans to see the eye doctor  Dyslipidemia-- labs  RTC 4 months

## 2015-02-19 NOTE — Progress Notes (Signed)
Pre visit review using our clinic review tool, if applicable. No additional management support is needed unless otherwise documented below in the visit note. 

## 2015-02-20 LAB — HIV ANTIBODY (ROUTINE TESTING W REFLEX): HIV 1&2 Ab, 4th Generation: NONREACTIVE

## 2015-02-21 MED ORDER — SITAGLIPTIN PHOSPHATE 100 MG PO TABS: 100.0000 mg | ORAL_TABLET | Freq: Every day | ORAL | Status: DC

## 2015-02-21 NOTE — Addendum Note (Signed)
Addended by: Wilfrid Lund on: 02/21/2015 02:24 PM   Modules accepted: Orders

## 2015-03-28 ENCOUNTER — Other Ambulatory Visit: Payer: Self-pay | Admitting: Internal Medicine

## 2015-04-04 ENCOUNTER — Other Ambulatory Visit: Payer: Self-pay

## 2015-04-04 ENCOUNTER — Telehealth: Payer: Self-pay | Admitting: Internal Medicine

## 2015-04-04 DIAGNOSIS — E119 Type 2 diabetes mellitus without complications: Secondary | ICD-10-CM

## 2015-04-04 MED ORDER — METFORMIN HCL 1000 MG PO TABS: 1000.0000 mg | ORAL_TABLET | Freq: Two times a day (BID) | ORAL | Status: DC

## 2015-04-04 NOTE — Telephone Encounter (Signed)
Relation to [id number] Call back number:[phone] Pharmacy: [place], [place] [phone] ([phone] (Fax)         Reason for call:  Patient requesting a refill metFORMIN (GLUCOPHAGE) 1000 MG tablet please send to mail order but patient is requesting a week worth of medication to go to retail  [facility], [place] [phone] (Phone) [phone] (Fax)

## 2015-04-04 NOTE — Telephone Encounter (Signed)
Metformin, #60 and 0 RF sent to [facility], and #180 and 0RF sent to [facility] as requested. Pt has F/U for DM in 05/2015.

## 2015-05-16 ENCOUNTER — Telehealth: Payer: Self-pay | Admitting: Internal Medicine

## 2015-05-16 NOTE — Telephone Encounter (Signed)
Caller name:Bahe,Barbara Relation to [id number]  Call back number:[phone]   Reason for call:  Patient had to reschedule 05/30/2015 due to provider schedule and the only other day patient can come in is the week of the 6th. The only availabilty is 05/31/15 in the afternoon, since patient is diabetic patient cant fast requesting lab orders only. Please advise

## 2015-05-16 NOTE — Telephone Encounter (Signed)
Dr. Larose Kells does not order labs prior to appts, either they can fast for appt or return another day for labs after appt.

## 2015-05-21 NOTE — Telephone Encounter (Signed)
Left VM on patient priority # to call back and schedule physical. Spouse # is disconnected.

## 2015-05-30 ENCOUNTER — Ambulatory Visit: Payer: BLUE CROSS/BLUE SHIELD | Admitting: Internal Medicine

## 2015-06-21 ENCOUNTER — Telehealth: Payer: Self-pay | Admitting: Internal Medicine

## 2015-06-21 DIAGNOSIS — E1165 Type 2 diabetes mellitus with hyperglycemia: Secondary | ICD-10-CM

## 2015-06-21 NOTE — Telephone Encounter (Signed)
Labs ordered. Called Pt's wife Pamala Hurry, informed her that Pt can call at his convenience to schedule lab appt for labs. Pamala Hurry verbalized understanding.

## 2015-06-21 NOTE — Addendum Note (Signed)
Addended byDamita Dunnings D on: 06/21/2015 02:11 PM   Modules accepted: Orders

## 2015-06-21 NOTE — Telephone Encounter (Signed)
Labs without office visit not ideal but will try to meet the patient halfway. Schedule A1c, BMP, AST, ALT-- dx DM

## 2015-06-21 NOTE — Telephone Encounter (Signed)
Spoke w/ Pt's wife, Pamala Hurry, she informed me that Pt only has vacation twice yearly, which was at the beginning of March (had to RIE that week and had to cancel). Next available vacation is in November. Forwarded phone call to Dr. Larose Kells CMA.

## 2015-06-21 NOTE — Telephone Encounter (Signed)
Also, spoke w/ Pamala Hurry, she emphasized that Pt only received two vacations yearly. Informed her that Pt's A1c at visit in November 2016 was 8.5, which is elevated. Informed her that importance of Pt coming back to have the lab rechecked. Barbara requesting orders for labs only at this time and then if needed maybe Pt can come back for a routine visit. Informed her I would send to Dr. Larose Kells for approval and would return her call when I receive feedback. Pamala Hurry verbalized understanding.

## 2015-07-26 ENCOUNTER — Other Ambulatory Visit (INDEPENDENT_AMBULATORY_CARE_PROVIDER_SITE_OTHER): Payer: BLUE CROSS/BLUE SHIELD

## 2015-07-26 DIAGNOSIS — E1165 Type 2 diabetes mellitus with hyperglycemia: Secondary | ICD-10-CM

## 2015-07-26 LAB — BASIC METABOLIC PANEL
BUN: 9 mg/dL (ref 6–23)
CHLORIDE: 100 meq/L (ref 96–112)
CO2: 24 mEq/L (ref 19–32)
CREATININE: 0.88 mg/dL (ref 0.40–1.50)
Calcium: 9.8 mg/dL (ref 8.4–10.5)
GFR: 97.77 mL/min (ref >60.00)
Glucose, Bld: 238 mg/dL — ABNORMAL HIGH (ref 70–99)
POTASSIUM: 3.5 meq/L (ref 3.5–5.1)
Sodium: 135 mEq/L (ref 135–145)

## 2015-07-26 LAB — ALT: ALT: 57 U/L — AB (ref 0–53)

## 2015-07-26 LAB — AST: AST: 40 U/L — AB (ref 0–37)

## 2015-07-26 LAB — HEMOGLOBIN A1C: Hgb A1c MFr Bld: 9.4 % — ABNORMAL HIGH (ref 4.6–6.5)

## 2015-08-01 MED ORDER — CANAGLIFLOZIN 100 MG PO TABS: 100.0000 mg | ORAL_TABLET | Freq: Every day | ORAL | Status: DC

## 2015-08-01 NOTE — Addendum Note (Signed)
Addended byDamita Dunnings D on: 08/01/2015 01:44 PM   Modules accepted: Orders

## 2015-08-03 ENCOUNTER — Telehealth: Payer: Self-pay | Admitting: Behavioral Health

## 2015-08-03 MED ORDER — CANAGLIFLOZIN 100 MG PO TABS: 100.0000 mg | ORAL_TABLET | Freq: Every day | ORAL | Status: DC

## 2015-08-03 MED ORDER — SITAGLIPTIN PHOSPHATE 100 MG PO TABS
100.0000 mg | ORAL_TABLET | Freq: Every day | ORAL | Status: DC
Start: 2015-08-03 — End: 2015-11-15

## 2015-08-03 NOTE — Telephone Encounter (Signed)
Spoke with the patient's spouse and informed her of the below lab results and provider's recommendations:  Notes Recorded by Colon Branch, MD on 07/29/2015 at 11:52 AM Currently on metformin 1000 mg twice a day and Januvia.  Advise patient: A1c is now much worse at 9.4, left is a slightly elevated. Recommend to add Invokana 100 mg one tablet daily #30 and 3 refills. While on this medication recommend to increase water intake Definitely work on diet and exercise Must RTC in 2 months, please make an appointment.  She verbalized understanding and did not have any further questions or concerns. Patient's spouse addressed that she will call the office back at a later time to schedule her husband's 2 month follow-up with PCP. Rx's sent to the patient's preferred pharmacy.

## 2015-08-06 ENCOUNTER — Other Ambulatory Visit: Payer: Self-pay

## 2015-08-06 MED ORDER — CANAGLIFLOZIN 100 MG PO TABS: 100.0000 mg | ORAL_TABLET | Freq: Every day | ORAL | Status: DC

## 2015-09-08 ENCOUNTER — Other Ambulatory Visit: Payer: Self-pay | Admitting: Internal Medicine

## 2015-11-15 ENCOUNTER — Other Ambulatory Visit: Payer: Self-pay | Admitting: Chiropractic Medicine

## 2015-11-15 ENCOUNTER — Ambulatory Visit
Admission: RE | Admit: 2015-11-15 | Discharge: 2015-11-15 | Disposition: A | Discharge status: Home / Self Care | Payer: BLUE CROSS/BLUE SHIELD | Source: Ambulatory Visit | Attending: Chiropractic Medicine | Admitting: Chiropractic Medicine

## 2015-11-15 ENCOUNTER — Ambulatory Visit (INDEPENDENT_AMBULATORY_CARE_PROVIDER_SITE_OTHER): Payer: BLUE CROSS/BLUE SHIELD | Admitting: Internal Medicine

## 2015-11-15 ENCOUNTER — Encounter: Payer: Self-pay | Admitting: Internal Medicine

## 2015-11-15 VITALS — BP 122/70 | HR 62 | Temp 97.6°F | Resp 12 | Ht 72.0 in | Wt 223.1 lb

## 2015-11-15 DIAGNOSIS — E785 Hyperlipidemia, unspecified: Secondary | ICD-10-CM

## 2015-11-15 DIAGNOSIS — R945 Abnormal results of liver function studies: Secondary | ICD-10-CM

## 2015-11-15 DIAGNOSIS — R7989 Other specified abnormal findings of blood chemistry: Secondary | ICD-10-CM

## 2015-11-15 DIAGNOSIS — E1165 Type 2 diabetes mellitus with hyperglycemia: Secondary | ICD-10-CM

## 2015-11-15 DIAGNOSIS — M5412 Radiculopathy, cervical region: Secondary | ICD-10-CM

## 2015-11-15 LAB — MICROALBUMIN / CREATININE URINE RATIO
Creatinine,U: 86.1 mg/dL
Microalb Creat Ratio: 0.8 mg/g (ref 0.0–30.0)
Microalb, Ur: <0.7 mg/dL (ref 0.0–1.9)

## 2015-11-15 LAB — BASIC METABOLIC PANEL
BUN: 9 mg/dL (ref 6–23)
CHLORIDE: 99 meq/L (ref 96–112)
CO2: 28 mEq/L (ref 19–32)
Calcium: 9.4 mg/dL (ref 8.4–10.5)
Creatinine, Ser: 0.93 mg/dL (ref 0.40–1.50)
GFR: 91.61 mL/min (ref >60.00)
Glucose, Bld: 164 mg/dL — ABNORMAL HIGH (ref 70–99)
POTASSIUM: 3.8 meq/L (ref 3.5–5.1)
Sodium: 136 mEq/L (ref 135–145)

## 2015-11-15 LAB — HEMOGLOBIN A1C: Hgb A1c MFr Bld: 7.3 % — ABNORMAL HIGH (ref 4.6–6.5)

## 2015-11-15 LAB — LDL CHOLESTEROL, DIRECT: Direct LDL: 124 mg/dL

## 2015-11-15 LAB — LIPID PANEL
CHOLESTEROL: 211 mg/dL — AB (ref 0–200)
HDL: 44.8 mg/dL (ref >39.00)
NonHDL: 166.28
TRIGLYCERIDES: 267 mg/dL — AB (ref 0.0–149.0)
Total CHOL/HDL Ratio: 5
VLDL: 53.4 mg/dL — ABNORMAL HIGH (ref 0.0–40.0)

## 2015-11-15 LAB — ALT: ALT: 46 U/L (ref 0–53)

## 2015-11-15 LAB — AST: AST: 32 U/L (ref 0–37)

## 2015-11-15 MED ORDER — METFORMIN HCL 1000 MG PO TABS: 1000.0000 mg | ORAL_TABLET | Freq: Two times a day (BID) | ORAL | 1 refills | Status: DC

## 2015-11-15 MED ORDER — SITAGLIPTIN PHOSPHATE 100 MG PO TABS: 100.0000 mg | ORAL_TABLET | Freq: Every day | ORAL | 1 refills | Status: DC

## 2015-11-15 NOTE — Assessment & Plan Note (Signed)
DM: Last A1c 9.4,  invokana 100 mg  was added to  Januvia and metformin. Not doing well with diet and exercise. Occasionally difficulties with getting his refills, best way to avoid problems is to come back for ROV as schedule. Foot exam negative today Plan: BMP, AST, ALT, A1c, micro. Improve lifestyle, consider increasing invokana to 300 mg. High cholesterol: Check a FLP Increase LFTs: He has been taking metformin for years, unlikely to be related to that medication, fatty liver?. Recheck today, check hepatitis serologies. Neck pain: Likely radiculopathy, working with a chiropractor, recommend to call for a referral if not improving soon. Avoid steroids RTC 3 months.

## 2015-11-15 NOTE — Patient Instructions (Signed)
GO TO THE LAB : Get the blood work     GO TO THE FRONT DESK Schedule your next appointment for a  routine checkup in 3 months    

## 2015-11-15 NOTE — Progress Notes (Signed)
Pre visit review using our clinic review tool, if applicable. No additional management support is needed unless otherwise documented below in the visit note. 

## 2015-11-15 NOTE — Progress Notes (Signed)
Subjective:    Patient ID: Gregory Williams., male    DOB: 05/14/1965, 50 y.o.   MRN: [id number]  DOS:  11/15/2015 Type of visit - description : Routine visit Interval history: DM: Last A1c elevated, invokana started. No recent ambulatory CBGs, not doing well with diet or exercising  High cholesterol: Last LDL elevated, due for a recheck. Also 2 months history of persisting neck pain, radiates to the left arm on and off, occasionally the left arm feels tingly and numb.seing a chiropractor x 2, slt better   Wt Readings from Last 3 Encounters:  11/15/15 223 lb 2 oz (101.2 kg)  02/19/15 229 lb 6 oz (104 kg)  09/29/14 231 lb (104.8 kg)     Review of Systems Denies bladder or bowel incontinence No difficulty coordinating his feet. No nausea, vomiting, diarrhea   Past Medical History:  Diagnosis Date  . Diabetes mellitus 2/09    Past Surgical History:  Procedure Laterality Date  . Kidney Stone Extraction      Social History   Social History  . Marital status: Married    Spouse name: Pamala Hurry  . Number of children: 3  . Years of education: N/A   Occupational History  . [employer]    Social History Main Topics  . Smoking status: Never Smoker  . Smokeless tobacco: Never Used  . Alcohol use 0.0 oz/week     Comment: socially ; just a few times yearly  . Drug use: No  . Sexual activity: Yes   Other Topics Concern  . Not on file   Social History Narrative  . No narrative on file        Medication List       Accurate as of 11/15/15  1:55 PM. Always use your most recent med list.          canagliflozin 100 MG Tabs tablet Commonly known as:  INVOKANA Take 1 tablet (100 mg total) by mouth daily.   metFORMIN 1000 MG tablet Commonly known as:  GLUCOPHAGE Take 1 tablet (1,000 mg total) by mouth 2 (two) times daily with a meal.   ONE TOUCH ULTRA TEST test strip Generic drug:  glucose blood Check blood sugar no more than twice daily.   onetouch  ultrasoft lancets Check blood sugar no more than twice daily.   sitaGLIPtin 100 MG tablet Commonly known as:  JANUVIA Take 1 tablet (100 mg total) by mouth daily.          Objective:   Physical Exam BP 122/70 (BP Location: Left Arm, Patient Position: Sitting, Cuff Size: Normal)   Pulse 62   Temp 97.6 F (36.4 C) (Oral)   Resp 12   Ht 6' (1.829 m)   Wt 223 lb 2 oz (101.2 kg)   SpO2 99%   BMI 30.26 kg/m  General:   Well developed, well nourished . NAD.  HEENT:  Normocephalic . Face symmetric, atraumatic Lungs:  CTA B Normal respiratory effort, no intercostal retractions, no accessory muscle use. Heart: RRR,  no murmur.  No pretibial edema bilaterally  DIABETIC FEET EXAM: No lower extremity edema Normal pedal pulses bilaterally Skin normal, nails normal, no calluses Pinprick examination of the feet normal. Neurologic:  alert & oriented X3.  Speech normal, gait appropriate for age and unassisted DTRs symmetric. Motor intact. Psych--  Cognition and judgment appear intact.  Cooperative with normal attention span and concentration.  Behavior appropriate. No anxious or depressed appearing.  Assessment & Plan:   Assessment> DM 2009 Dyslipidemia elevated TG Kidney stones Rectus diastasis Elevated LFTs, first noted 2014, on metformin for years   PLAN: DM: Last A1c 9.4,  invokana 100 mg  was added to  Januvia and metformin. Not doing well with diet and exercise. Occasionally difficulties with getting his refills, best way to avoid problems is to come back for ROV as schedule. Foot exam negative today Plan: BMP, AST, ALT, A1c, micro. Improve lifestyle, consider increasing invokana to 300 mg. High cholesterol: Check a FLP Increase LFTs: He has been taking metformin for years, unlikely to be related to that medication, fatty liver?. Recheck today, check hepatitis serologies. Neck pain: Likely radiculopathy, working with a chiropractor, recommend to call for a  referral if not improving soon. Avoid steroids RTC 3 months.

## 2015-11-16 LAB — HEPATITIS B CORE ANTIBODY, TOTAL: Hep B Core Total Ab: NONREACTIVE

## 2015-11-16 LAB — HEPATITIS C ANTIBODY: HCV Ab: NEGATIVE

## 2015-11-16 LAB — HEPATITIS B SURFACE ANTIBODY,QUALITATIVE: HEP B S AB: NEGATIVE

## 2015-11-16 LAB — HEPATITIS B SURFACE ANTIGEN: Hepatitis B Surface Ag: NEGATIVE

## 2015-11-16 MED ORDER — CANAGLIFLOZIN 300 MG PO TABS: 300.0000 mg | ORAL_TABLET | Freq: Every day | ORAL | 0 refills | Status: DC

## 2015-11-16 MED ORDER — CANAGLIFLOZIN 300 MG PO TABS
300.0000 mg | ORAL_TABLET | Freq: Every day | ORAL | 0 refills | Status: DC
Start: 2015-11-16 — End: 2016-02-18

## 2015-11-16 NOTE — Addendum Note (Signed)
Addended byDamita Dunnings D on: 11/16/2015 01:13 PM   Modules accepted: Orders

## 2015-12-24 ENCOUNTER — Encounter: Payer: Self-pay | Admitting: Internal Medicine

## 2015-12-24 ENCOUNTER — Ambulatory Visit (INDEPENDENT_AMBULATORY_CARE_PROVIDER_SITE_OTHER): Payer: BLUE CROSS/BLUE SHIELD | Admitting: Internal Medicine

## 2015-12-24 ENCOUNTER — Other Ambulatory Visit: Payer: Self-pay

## 2015-12-24 VITALS — BP 118/68 | HR 79 | Temp 98.2°F | Resp 14 | Ht 72.0 in | Wt 219.5 lb

## 2015-12-24 DIAGNOSIS — K625 Hemorrhage of anus and rectum: Secondary | ICD-10-CM

## 2015-12-24 MED ORDER — HYDROCORTISONE ACETATE 25 MG RE SUPP: 25.0000 mg | Freq: Two times a day (BID) | RECTAL | 1 refills | Status: DC | PRN

## 2015-12-24 NOTE — Progress Notes (Signed)
Pre visit review using our clinic review tool, if applicable. No additional management support is needed unless otherwise documented below in the visit note. 

## 2015-12-24 NOTE — Progress Notes (Signed)
Subjective:    Patient ID: Gregory Conk., male    DOB: 05-17-1965, 50 y.o.   MRN: [id number]  DOS:  12/24/2015 Type of visit - description : acute Interval history: Symptoms started 2 weeks ago with itching and rectal discomfort, it gradually went away however today he saw some blood in his underwear, subsequently he had a bowel movement and he saw "a lot" of red blood in the toilet paper, 10-20 mL ? Later on, after he was sitting for a while he saw some blood going across his underwear and pants. He is currently having no pain or major rectal discomfort. No history of previous hemorrhoids.   Review of Systems Denies abdominal pain, nausea, vomiting. No constipation or hard stools. He did have a cold, feeling better, no fever No gross hematuria or gum bleeding  Past Medical History:  Diagnosis Date  . Diabetes mellitus 2/09    Past Surgical History:  Procedure Laterality Date  . Kidney Stone Extraction      Social History   Social History  . Marital status: Married    Spouse name: Pamala Hurry  . Number of children: 3  . Years of education: N/A   Occupational History  . [employer]    Social History Main Topics  . Smoking status: Never Smoker  . Smokeless tobacco: Never Used  . Alcohol use 0.0 oz/week     Comment: socially ; just a few times yearly  . Drug use: No  . Sexual activity: Yes   Other Topics Concern  . Not on file   Social History Narrative  . No narrative on file        Medication List       Accurate as of 12/24/15  2:28 PM. Always use your most recent med list.          canagliflozin 300 MG Tabs tablet Commonly known as:  INVOKANA Take 1 tablet (300 mg total) by mouth daily before breakfast.   metFORMIN 1000 MG tablet Commonly known as:  GLUCOPHAGE Take 1 tablet (1,000 mg total) by mouth 2 (two) times daily with a meal.   ONE TOUCH ULTRA TEST test strip Generic drug:  glucose blood Check blood sugar no more than twice  daily.   onetouch ultrasoft lancets Check blood sugar no more than twice daily.   sitaGLIPtin 100 MG tablet Commonly known as:  JANUVIA Take 1 tablet (100 mg total) by mouth daily.          Objective:   Physical Exam BP 118/68 (BP Location: Left Arm, Patient Position: Sitting, Cuff Size: Normal)   Pulse 79   Temp 98.2 F (36.8 C) (Oral)   Resp 14   Ht 6' (1.829 m)   Wt 219 lb 8 oz (99.6 kg)   SpO2 97%   BMI 29.77 kg/m  General:   Well developed, well nourished . NAD.  HEENT:  Normocephalic . Face symmetric, atraumatic Lungs:  CTA B Normal respiratory effort, no intercostal retractions, no accessory muscle use. Heart: RRR,  no murmur.  no pretibial edema bilaterally  Abdomen:  Not distended, soft, non-tender. No rebound or rigidity.  Rectal:  External abnormalities: has a single, 1 cm external hemorrhoid, no TTP. Normal sphincter tone. No rectal masses or tenderness.  No stools found, some mucus with red blood found. Anoscopy: Multiple internal hemorrhoids,  one of the hemorrhoids seems erythematous..  Skin: Not pale. Not jaundice Neurologic:  alert & oriented X3.  Speech normal, gait appropriate  for age and unassisted Psych--  Cognition and judgment appear intact.  Cooperative with normal attention span and concentration.  Behavior appropriate. No anxious or depressed appearing.    Assessment & Plan:   Assessment> DM 2009 Dyslipidemia elevated TG Kidney stones Rectus diastasis Elevated LFTs, first noted 2014, on metformin for years   PLAN: Red blood per rectum: Sx likely due to hemorrhoids based on the history and physical examination, other dx less likely (?proctitis);  he is describing abundant bleeding which make me somewhat concerned about him. Plan: Metamucil, anusol HC, CBC, PTT, PT. I contacted GI b/c the h/o abundant bleeding, they will be able to see him within few days (if bleeding continued to be severe he may need a banding or other  treatments).  ER if symptoms severe. See instructions

## 2015-12-24 NOTE — Patient Instructions (Signed)
GO TO THE LAB : Get the blood work      Metamucil 2 tablets daily with fluids  Apply a suppository twice a day for 2 days, then as needed  Expect a phone call from your gastroenterologist. They are planning to see you within the next few days  Call or go to the ER if: Severe bleeding (more than 10 or 15 ML's), feeling weak, dizzy.

## 2015-12-25 LAB — CBC WITH DIFFERENTIAL/PLATELET
BASOS PCT: 0.4 % (ref 0.0–3.0)
Basophils Absolute: 0 10*3/uL (ref 0.0–0.1)
EOS ABS: 0.1 10*3/uL (ref 0.0–0.7)
Eosinophils Relative: 1.6 % (ref 0.0–5.0)
HCT: 50.2 % (ref 39.0–52.0)
HEMOGLOBIN: 17.2 g/dL — AB (ref 13.0–17.0)
Lymphocytes Relative: 32.6 % (ref 12.0–46.0)
Lymphs Abs: 2.8 10*3/uL (ref 0.7–4.0)
MCHC: 34.3 g/dL (ref 30.0–36.0)
MCV: 89.5 fl (ref 78.0–100.0)
MONO ABS: 0.7 10*3/uL (ref 0.1–1.0)
Monocytes Relative: 8.3 % (ref 3.0–12.0)
NEUTROS ABS: 4.9 10*3/uL (ref 1.4–7.7)
Neutrophils Relative %: 57.1 % (ref 43.0–77.0)
PLATELETS: 203 10*3/uL (ref 150.0–400.0)
RBC: 5.61 Mil/uL (ref 4.22–5.81)
RDW: 12.8 % (ref 11.5–15.5)
WBC: 8.6 10*3/uL (ref 4.0–10.5)

## 2015-12-25 LAB — APTT: aPTT: 25 s (ref 23.4–32.7)

## 2015-12-25 LAB — PROTIME-INR
INR: 0.9 ratio (ref 0.8–1.0)
PROTHROMBIN TIME: 9.8 s (ref 9.6–13.1)

## 2015-12-25 NOTE — Assessment & Plan Note (Signed)
Red blood per rectum: Sx likely due to hemorrhoids based on the history and physical examination, other dx less likely (?proctitis);  he is describing abundant bleeding which make me somewhat concerned about him. Plan: Metamucil, anusol HC, CBC, PTT, PT. I contacted GI b/c the h/o abundant bleeding, they will be able to see him within few days (if bleeding continued to be severe he may need a banding or other treatments).  ER if symptoms severe. See instructions

## 2015-12-27 ENCOUNTER — Telehealth: Payer: Self-pay

## 2015-12-27 NOTE — Telephone Encounter (Signed)
Ok, please let him know to keep that Wed appt with me and to go to the ER if bleeding increases. His Hb is 17 currently.

## 2015-12-27 NOTE — Telephone Encounter (Signed)
Dr Ardis Hughs no sooner appt's available with any provider.

## 2015-12-27 NOTE — Telephone Encounter (Signed)
Left message on machine to call back  

## 2015-12-27 NOTE — Telephone Encounter (Signed)
-----   Message from Milus Banister, MD sent at 12/27/2015  7:37 AM EDT ----- Regarding: RE: Red blood per rectum I do not have office time before next Wednesday (day of his appt) but I'll see about getting him in with extender or other MD sooner. THanks  [employer], See above.  Sooner appt with any provider would be great.  Thanks   ----- Message ----- From: Colon Branch, MD Sent: 12/26/2015   5:36 PM To: Milus Banister, MD, Colon Branch, MD Subject: Red blood per rectum                           We talk about this patient a couple of days ago, labs were okay but he continue to bleed just as before. If you could see him a little sooner would be great. I think he is already on the cancellation list Thx! JP

## 2015-12-27 NOTE — Telephone Encounter (Signed)
See alternate note  

## 2015-12-27 NOTE — Telephone Encounter (Signed)
The pt was offered an 11:30 am appt tomorrow but the pt is unable to keep this appt, he will come in on 01/02/16 as previously scheduled and will go to the ED if bleeding worsens.

## 2016-01-02 ENCOUNTER — Encounter: Payer: Self-pay | Admitting: Gastroenterology

## 2016-01-02 ENCOUNTER — Ambulatory Visit (INDEPENDENT_AMBULATORY_CARE_PROVIDER_SITE_OTHER): Payer: BLUE CROSS/BLUE SHIELD | Admitting: Gastroenterology

## 2016-01-02 ENCOUNTER — Encounter (INDEPENDENT_AMBULATORY_CARE_PROVIDER_SITE_OTHER): Payer: Self-pay

## 2016-01-02 VITALS — BP 114/80 | HR 72 | Wt 218.5 lb

## 2016-01-02 DIAGNOSIS — K625 Hemorrhage of anus and rectum: Secondary | ICD-10-CM | POA: Diagnosis not present

## 2016-01-02 DIAGNOSIS — K649 Unspecified hemorrhoids: Secondary | ICD-10-CM | POA: Diagnosis not present

## 2016-01-02 NOTE — Patient Instructions (Signed)
Continue daily fiber. Call if you have recurrent bleeding or if this episode lasts for more than 2-3 more weeks. Would consider hemorrhoidal banding procedure if that is the case.

## 2016-01-02 NOTE — Progress Notes (Signed)
Review of pertinent gastrointestinal problems: 1. Precancerous polyps: Colonoscopy Dr.  Ardis Hughs 05/2014 found 2 pedunculated sigmoid polyps (3cm and 1.5cm). Pathology from one of the polyps suggested adenomatous tissue at cautery margin.  Repeat examination, sigmoidoscopy 09/2014 showed no recurrent/residual polyp at the sites, recall set for 3 years.  HPI: This is a  very pleasant 50 year old man      who was referred to me by Colon Branch, MD  to evaluate  rectal bleeding .    Chief complaint is rectal bleeding    Was in a car, sitting for a long time.  No pains but a lot of rectal bleeding.  He was seen by his primary care physician who felt he had hemorrhoids. He start a fiber supplement around that time and has been putting Anusol suppositories in once daily since then. He is very clear that the bleeding has decreased.  Spotting has decreased.  No constipation.  Eats high fiber.   Cbc last week was normal.  Review of systems: Pertinent positive and negative review of systems were noted in the above HPI section. Complete review of systems was performed and was otherwise normal.   Past Medical History:  Diagnosis Date  . Diabetes mellitus 2/09    Past Surgical History:  Procedure Laterality Date  . Kidney Stone Extraction      Current Outpatient Prescriptions  Medication Sig Dispense Refill  . canagliflozin (INVOKANA) 300 MG TABS tablet Take 1 tablet (300 mg total) by mouth daily before breakfast. 90 tablet 0  . hydrocortisone (ANUSOL-HC) 25 MG suppository Place 1 suppository (25 mg total) rectally 2 (two) times daily as needed for hemorrhoids or itching. 12 suppository 1  . Lancets (ONETOUCH ULTRASOFT) lancets Check blood sugar no more than twice daily. 100 each 12  . metFORMIN (GLUCOPHAGE) 1000 MG tablet Take 1 tablet (1,000 mg total) by mouth 2 (two) times daily with a meal. 180 tablet 1  . ONE TOUCH ULTRA TEST test strip Check blood sugar no more than twice daily. 100  each 12  . sitaGLIPtin (JANUVIA) 100 MG tablet Take 1 tablet (100 mg total) by mouth daily. 90 tablet 1   No current facility-administered medications for this visit.     Allergies as of 01/02/2016  . (No Known Allergies)    Family History  Problem Relation Age of Onset  . Prostate cancer Father 18  . Diabetes Father   . Parkinsonism Father   . Colon polyps Mother     in her 14s  . Hypertension Mother   . Colon cancer Maternal Grandmother     dx late in life  . Diabetes      father family  . Diabetes Paternal Aunt   . Stroke Neg Hx   . Heart attack Neg Hx   . Esophageal cancer Neg Hx   . Rectal cancer Neg Hx   . Stomach cancer Neg Hx     Social History   Social History  . Marital status: Married    Spouse name: Pamala Hurry  . Number of children: 3  . Years of education: N/A   Occupational History  . [employer]    Social History Main Topics  . Smoking status: Never Smoker  . Smokeless tobacco: Never Used  . Alcohol use 0.0 oz/week     Comment: socially ; just a few times yearly  . Drug use: No  . Sexual activity: Yes    Partners: Female   Other Topics Concern  . Not  on file   Social History Narrative  . No narrative on file     Physical Exam: BP 114/80   Pulse 72   Wt 218 lb 8 oz (99.1 kg)   BMI 29.63 kg/m  Constitutional: generally well-appearing Psychiatric: alert and oriented x3 Eyes: extraocular movements intact Mouth: oral pharynx moist, no lesions Neck: supple no lymphadenopathy Cardiovascular: heart regular rate and rhythm Lungs: clear to auscultation bilaterally Abdomen: soft, nontender, nondistended, no obvious ascites, no peritoneal signs, normal bowel sounds Extremities: no lower extremity edema bilaterally Skin: no lesions on visible extremities Rectal examination: One small slightly swollen external hemorrhoid that seems to communicate with smooth internal hemorrhoid that is also swollen by digital exam. Dextro hemorrhoid has a  small red, approximately 1 mm, spot on it that I suspect is the site of previous bleeding. No tenderness, no fluctuance.  Assessment and plan: 50 y.o. male with  hemorrhoidal bleeding  The bleeding seems to be improving over the past several days. He is going to continue fiber supplements on a daily basis. He knows to call here if he has recurrent bleeding or if this episode does not completely resolve over the next 2-3 weeks. I did feel what seemed like smooth slightly swollen internal hemorrhoids and if he has recurrent bleeding he is probably a very good candidate for internal hemorrhoid banding procedure.   Owens Loffler, MD [facility] 01/02/2016, 2:47 PM  Cc: Colon Branch, MD

## 2016-02-18 ENCOUNTER — Ambulatory Visit (INDEPENDENT_AMBULATORY_CARE_PROVIDER_SITE_OTHER): Payer: BLUE CROSS/BLUE SHIELD | Admitting: Internal Medicine

## 2016-02-18 ENCOUNTER — Encounter: Payer: Self-pay | Admitting: Internal Medicine

## 2016-02-18 VITALS — BP 116/76 | HR 71 | Temp 97.7°F | Resp 12 | Ht 72.0 in | Wt 223.0 lb

## 2016-02-18 DIAGNOSIS — Z23 Encounter for immunization: Secondary | ICD-10-CM | POA: Diagnosis not present

## 2016-02-18 DIAGNOSIS — E118 Type 2 diabetes mellitus with unspecified complications: Secondary | ICD-10-CM | POA: Diagnosis not present

## 2016-02-18 LAB — HEMOGLOBIN A1C: Hgb A1c MFr Bld: 7.5 % — ABNORMAL HIGH (ref 4.6–6.5)

## 2016-02-18 MED ORDER — METFORMIN HCL 1000 MG PO TABS: 1000.0000 mg | ORAL_TABLET | Freq: Two times a day (BID) | ORAL | 1 refills | Status: DC

## 2016-02-18 MED ORDER — CANAGLIFLOZIN 300 MG PO TABS: 300.0000 mg | ORAL_TABLET | Freq: Every day | ORAL | 1 refills | Status: DC

## 2016-02-18 MED ORDER — SITAGLIPTIN PHOSPHATE 100 MG PO TABS: 100.0000 mg | ORAL_TABLET | Freq: Every day | ORAL | 1 refills | Status: DC

## 2016-02-18 NOTE — Progress Notes (Signed)
Pre visit review using our clinic review tool, if applicable. No additional management support is needed unless otherwise documented below in the visit note. 

## 2016-02-18 NOTE — Patient Instructions (Signed)
GO TO THE LAB : Get the blood work     GO TO THE FRONT DESK Schedule your next appointment for a   physical exam, fasting in 4 months

## 2016-02-18 NOTE — Progress Notes (Signed)
Subjective:    Patient ID: Gregory Williams., male    DOB: 10-31-1965, 50 y.o.   MRN: [id number]  DOS:  02/18/2016 Type of visit - description : Routine checkup Interval history:  Rectal bleeding: Saw GI, note reviewed DM: Good med compliance, diet is okay, needs to exercise more. Ambulatory CBGs 130, 135. No apparent side effects   Review of Systems Denies nausea, vomiting. No rash  Past Medical History:  Diagnosis Date  . Diabetes mellitus 2/09    Past Surgical History:  Procedure Laterality Date  . Kidney Stone Extraction      Social History   Social History  . Marital status: Married    Spouse name: Pamala Hurry  . Number of children: 3  . Years of education: N/A   Occupational History  . [employer]    Social History Main Topics  . Smoking status: Never Smoker  . Smokeless tobacco: Never Used  . Alcohol use 0.0 oz/week     Comment: socially ; just a few times yearly  . Drug use: No  . Sexual activity: Yes    Partners: Female   Other Topics Concern  . Not on file   Social History Narrative  . No narrative on file        Medication List       Accurate as of 02/18/16  8:32 PM. Always use your most recent med list.          canagliflozin 300 MG Tabs tablet Commonly known as:  INVOKANA Take 1 tablet (300 mg total) by mouth daily before breakfast.   metFORMIN 1000 MG tablet Commonly known as:  GLUCOPHAGE Take 1 tablet (1,000 mg total) by mouth 2 (two) times daily with a meal.   ONE TOUCH ULTRA TEST test strip Generic drug:  glucose blood Check blood sugar no more than twice daily.   onetouch ultrasoft lancets Check blood sugar no more than twice daily.   sitaGLIPtin 100 MG tablet Commonly known as:  JANUVIA Take 1 tablet (100 mg total) by mouth daily.          Objective:   Physical Exam BP 116/76 (BP Location: Left Arm, Patient Position: Sitting, Cuff Size: Normal)   Pulse 71   Temp 97.7 F (36.5 C) (Oral)   Resp 12   Ht 6'  (1.829 m)   Wt 223 lb (101.2 kg)   SpO2 96%   BMI 30.24 kg/m  General:   Well developed, well nourished . NAD.  HEENT:  Normocephalic . Face symmetric, atraumatic Lungs:  CTA B Normal respiratory effort, no intercostal retractions, no accessory muscle use. Heart: RRR,  no murmur.  No pretibial edema bilaterally  Skin: Not pale. Not jaundice Neurologic:  alert & oriented X3.  Speech normal, gait appropriate for age and unassisted Psych--  Cognition and judgment appear intact.  Cooperative with normal attention span and concentration.  Behavior appropriate. No anxious or depressed appearing.      Assessment & Plan:   Assessment DM 2009 Dyslipidemia elevated TG Kidney stones Rectus diastasis Elevated LFTs, first noted 2014, on metformin for years . Hepatitis serology (-) 10-2015  PLAN: RBPR: Saw GI, was feeling better, they Rx observation, felt to be due to hemorrhoids. DM: On Januvia, metformin and invokana;  no apparent s/e, last A1c 7.3 (improving). We talk about importance of continuing a healthy diet and increase physical activity. Check A1c. If it is trending in the right direction, will not change medications. [id number]  numbers  will get better once diabetes improves and lifestyle corrected. Recheck on RTC 1 care-- flu shot  RTC 4 months , CPX

## 2016-02-18 NOTE — Assessment & Plan Note (Signed)
RBPR: Saw GI, was feeling better, they Rx observation, felt to be due to hemorrhoids. DM: On Januvia, metformin and invokana;  no apparent s/e, last A1c 7.3 (improving). We talk about importance of continuing a healthy diet and increase physical activity. Check A1c. If it is trending in the right direction, will not change medications. Dyslipidemia:expect  numbers will get better once diabetes improves and lifestyle corrected. Recheck on RTC 1 care-- flu shot  RTC 4 months ,

## 2016-02-25 MED ORDER — PIOGLITAZONE HCL 30 MG PO TABS: 30.0000 mg | ORAL_TABLET | Freq: Every day | ORAL | 5 refills | Status: DC

## 2016-02-25 NOTE — Addendum Note (Signed)
Addended byDamita Dunnings D on: 02/25/2016 12:35 PM   Modules accepted: Orders

## 2016-05-28 ENCOUNTER — Ambulatory Visit (INDEPENDENT_AMBULATORY_CARE_PROVIDER_SITE_OTHER): Payer: BLUE CROSS/BLUE SHIELD | Admitting: Internal Medicine

## 2016-05-28 ENCOUNTER — Encounter: Payer: Self-pay | Admitting: Internal Medicine

## 2016-05-28 VITALS — BP 122/78 | HR 72 | Temp 97.8°F | Resp 14 | Ht 72.0 in | Wt 221.5 lb

## 2016-05-28 DIAGNOSIS — N200 Calculus of kidney: Secondary | ICD-10-CM

## 2016-05-28 DIAGNOSIS — E118 Type 2 diabetes mellitus with unspecified complications: Secondary | ICD-10-CM

## 2016-05-28 DIAGNOSIS — Z0000 Encounter for general adult medical examination without abnormal findings: Secondary | ICD-10-CM

## 2016-05-28 DIAGNOSIS — E785 Hyperlipidemia, unspecified: Secondary | ICD-10-CM

## 2016-05-28 DIAGNOSIS — Z23 Encounter for immunization: Secondary | ICD-10-CM

## 2016-05-28 HISTORY — DX: Calculus of kidney: N20.0

## 2016-05-28 HISTORY — DX: Hyperlipidemia, unspecified: E78.5

## 2016-05-28 LAB — BASIC METABOLIC PANEL
BUN: 10 mg/dL (ref 6–23)
CALCIUM: 10.2 mg/dL (ref 8.4–10.5)
CO2: 24 mEq/L (ref 19–32)
Chloride: 101 mEq/L (ref 96–112)
Creatinine, Ser: 0.98 mg/dL (ref 0.40–1.50)
GFR: 86.05 mL/min (ref >60.00)
GLUCOSE: 200 mg/dL — AB (ref 70–99)
POTASSIUM: 4 meq/L (ref 3.5–5.1)
SODIUM: 135 meq/L (ref 135–145)

## 2016-05-28 LAB — LIPID PANEL
CHOL/HDL RATIO: 4
CHOLESTEROL: 193 mg/dL (ref 0–200)
HDL: 47 mg/dL (ref >39.00)
LDL CALC: 111 mg/dL — AB (ref 0–99)
NonHDL: 146.29
TRIGLYCERIDES: 174 mg/dL — AB (ref 0.0–149.0)
VLDL: 34.8 mg/dL (ref 0.0–40.0)

## 2016-05-28 LAB — AST: AST: 21 U/L (ref 0–37)

## 2016-05-28 LAB — HEMOGLOBIN A1C: Hgb A1c MFr Bld: 8 % — ABNORMAL HIGH (ref 4.6–6.5)

## 2016-05-28 LAB — ALT: ALT: 36 U/L (ref 0–53)

## 2016-05-28 NOTE — Assessment & Plan Note (Addendum)
Td 2013, prevnar 2016, pnm 23: 05-2016 Had a flu shot  Prostate Ca screening: +FH, father age 50;   DRE -PSA  wnl 01-2015 CCS: FH colon ca- GM FH colon polyps- mother  Cscope 05-2014, flex sig 09-2014 ok, next cscope 2019 Labs: BMP, AST, ALT, FLP, A1c Diet and exercise discussed

## 2016-05-28 NOTE — Patient Instructions (Signed)
GO TO THE LAB : Get the blood work     GO TO THE FRONT DESK Schedule your next appointment for a   routine checkup in 4 months 

## 2016-05-28 NOTE — Progress Notes (Signed)
Subjective:    Patient ID: Gregory Williams., male    DOB: May 19, 1965, 51 y.o.   MRN: [id number]  DOS:  05/28/2016 Type of visit - description : cpx, here w/ wife Interval history: Since the last visit, he started Actos without apparent side effects. Had a lot of stress at work, not been eating properly for the last 3 months but is getting back on track. CBGs in the morning 130, 135. He checks infrequently  in the afternoons but reportedly is "much higher" (readings?).   Review of Systems  Other than above, a 14 point review of systems is negative     Past Medical History:  Diagnosis Date  . Diabetes mellitus 2/09  . Dyslipidemia 05/28/2016  . Kidney stones 05/28/2016    Past Surgical History:  Procedure Laterality Date  . Kidney Stone Extraction      Social History   Social History  . Marital status: Married    Spouse name: Pamala Hurry  . Number of children: 3  . Years of education: N/A   Occupational History  . [employer]    Social History Main Topics  . Smoking status: Never Smoker  . Smokeless tobacco: Never Used  . Alcohol use 0.0 oz/week     Comment: socially ; just a few times yearly  . Drug use: No  . Sexual activity: Yes    Partners: Female   Other Topics Concern  . 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 Prostate cancer Father 63  . Diabetes Father   . Parkinsonism Father   . Colon polyps Mother     in her 19s  . Hypertension Mother   . Colon cancer Maternal Grandmother     dx late in life  . Diabetes      father family  . Diabetes Paternal Aunt   . Stroke Neg Hx   . Heart attack Neg Hx   . Esophageal cancer Neg Hx   . Rectal cancer Neg Hx   . Stomach cancer Neg Hx      Allergies as of 05/28/2016   No Known Allergies     Medication List       Accurate as of 05/28/16 11:59 PM. Always use your most recent med list.            canagliflozin 300 MG Tabs tablet Commonly known as:  INVOKANA Take 1 tablet (300 mg total) by mouth daily before breakfast.   metFORMIN 1000 MG tablet Commonly known as:  GLUCOPHAGE Take 1 tablet (1,000 mg total) by mouth 2 (two) times daily with a meal.   ONE TOUCH ULTRA TEST test strip Generic drug:  glucose blood Check blood sugar no more than twice daily.   onetouch ultrasoft lancets Check blood sugar no more than twice daily.   pioglitazone 30 MG tablet Commonly known as:  ACTOS Take 1 tablet (30 mg total) by mouth daily.   sitaGLIPtin 100 MG tablet Commonly known as:  JANUVIA Take 1 tablet (100 mg total) by mouth daily.          Objective:   Physical Exam BP 122/78 (BP Location: Left Arm, Patient Position: Sitting, Cuff Size: Normal)   Pulse 72   Temp 97.8 F (36.6 C) (Oral)   Resp 14   Ht 6' (1.829 m)   Wt 221 lb  8 oz (100.5 kg)   SpO2 98%   BMI 30.04 kg/m   General:   Well developed, well nourished . NAD.  Neck: No  thyromegaly  HEENT:  Normocephalic . Face symmetric, atraumatic Lungs:  CTA B Normal respiratory effort, no intercostal retractions, no accessory muscle use. Heart: RRR,  no murmur.  No pretibial edema bilaterally  Abdomen:  Not distended, soft, non-tender. No rebound or rigidity.   Skin: Exposed areas without rash. Not pale. Not jaundice Neurologic:  alert & oriented X3.  Speech normal, gait appropriate for age and unassisted Strength symmetric and appropriate for age.  Psych: Cognition and judgment appear intact.  Cooperative with normal attention span and concentration.  Behavior appropriate. No anxious or depressed appearing.    Assessment & Plan:   Assessment DM 2009 Dyslipidemia elevated TG Kidney stones Rectus diastasis Elevated LFTs, first noted 2014, on metformin for years . Hepatitis serology (-) 10-2015  PLAN: DM: A1c back in November 2017 increased to 7.5, Actos was added to Januvia, metformin and invokana;  unfortunately, diet has been poor in the last 3 months but he is getting back on track. Check an A1c. If not at goal consider insulin or Endo referral; states will reluctant to take more medicine. Concept of DM legacy d/w pt  Dyslipidemia: Checking labs. Consider medications RTC 4 months

## 2016-05-28 NOTE — Progress Notes (Signed)
Pre visit review using our clinic review tool, if applicable. No additional management support is needed unless otherwise documented below in the visit note. 

## 2016-06-01 NOTE — Assessment & Plan Note (Signed)
DM: A1c back in November 2017 increased to 7.5, Actos was added to Januvia, metformin and invokana; unfortunately, diet has been poor in the last 3 months but he is getting back on track. Check an A1c. If not at goal consider insulin or Endo referral; states will reluctant to take more medicine. Concept of DM legacy d/w pt  Dyslipidemia: Checking labs. Consider medications RTC 4 months

## 2016-06-12 ENCOUNTER — Encounter: Payer: Self-pay | Admitting: Endocrinology

## 2016-06-25 ENCOUNTER — Other Ambulatory Visit: Payer: Self-pay | Admitting: Internal Medicine

## 2016-08-20 ENCOUNTER — Encounter: Payer: Self-pay | Admitting: Internal Medicine

## 2016-08-20 DIAGNOSIS — E1165 Type 2 diabetes mellitus with hyperglycemia: Principal | ICD-10-CM

## 2016-08-20 DIAGNOSIS — IMO0001 Reserved for inherently not codable concepts without codable children: Secondary | ICD-10-CM

## 2016-08-20 MED ORDER — CANAGLIFLOZIN 300 MG PO TABS: 300.0000 mg | ORAL_TABLET | Freq: Every day | ORAL | 0 refills | Status: DC

## 2016-09-14 IMAGING — CR DG CERVICAL SPINE WITH FLEX & EXTEND
7 series · 7 of 7 positions shown · non-contrast
Comparison: None.

CLINICAL DATA: Left upper extremity radicular symptoms for 2 months

EXAM:
CERVICAL SPINE COMPLETE WITH FLEXION AND EXTENSION VIEWS

[w cervical spine lat]
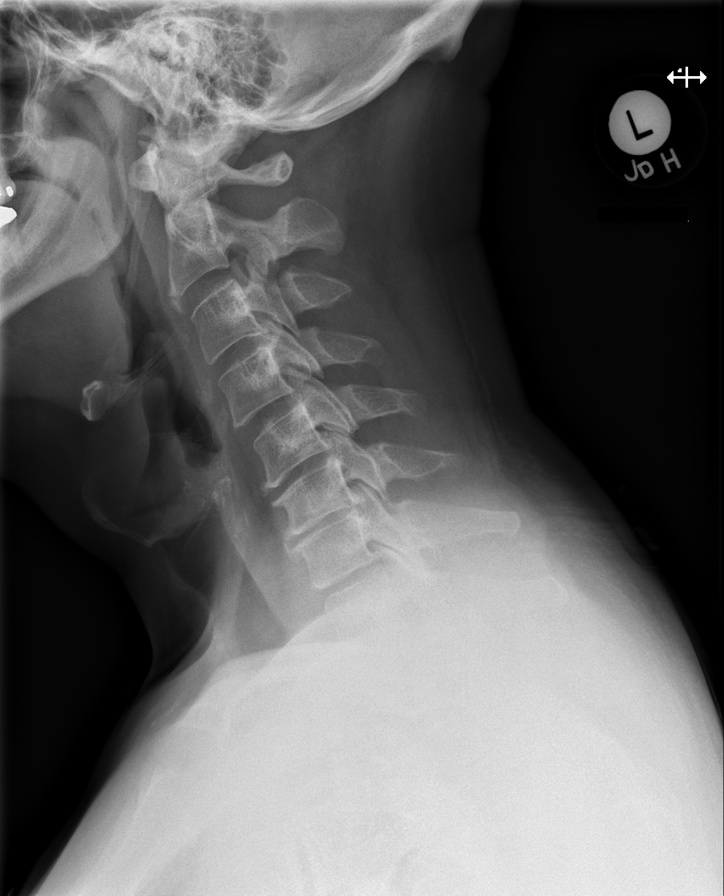

[w cervical spine flexion]
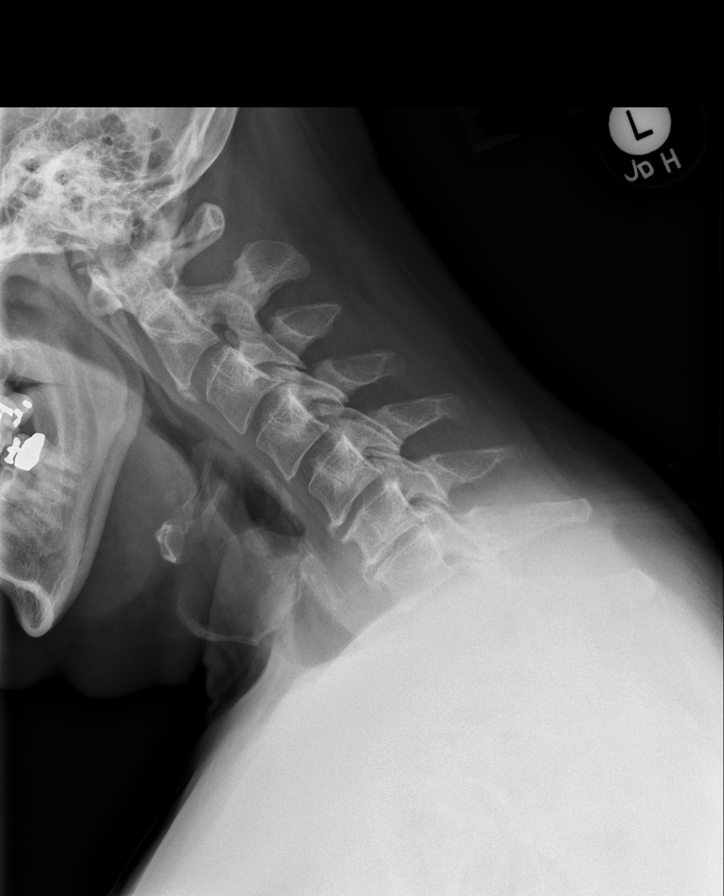

[w cervical spine extension]
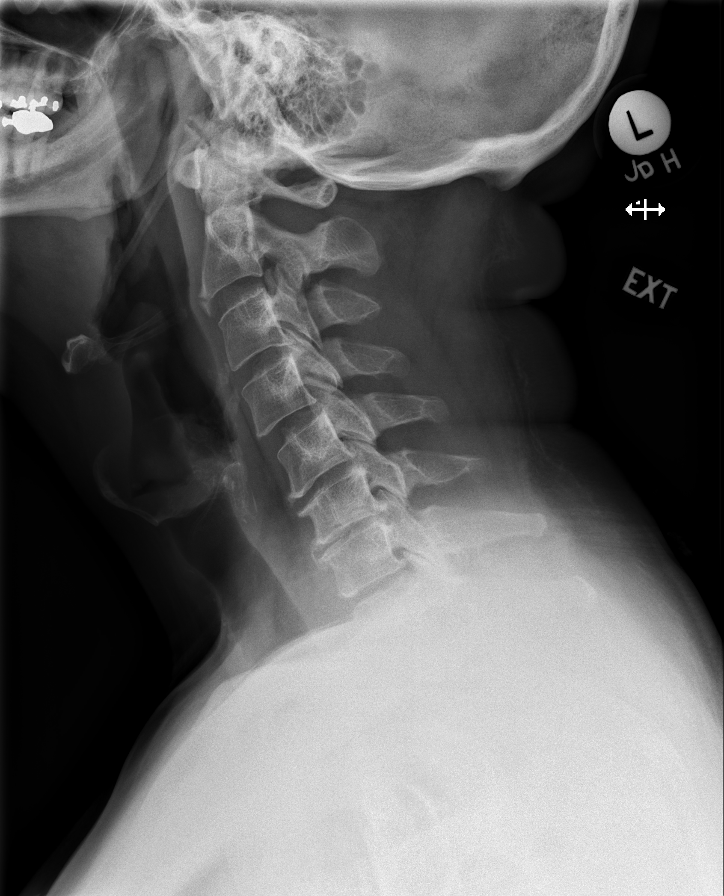

[w cervical spine ap_obl (1 of 2)]
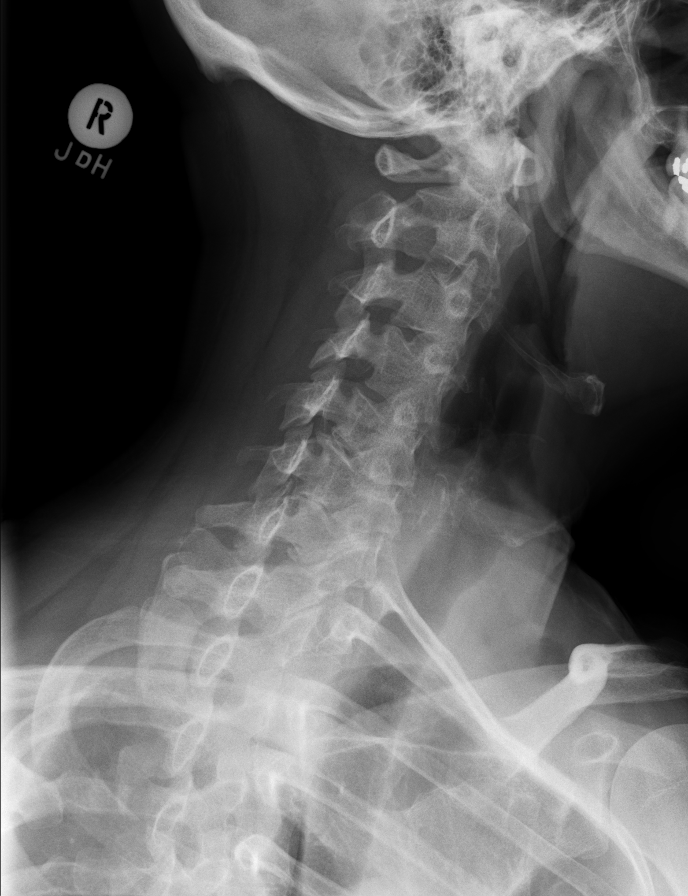

[w cervical spine ap_obl (2 of 2)]
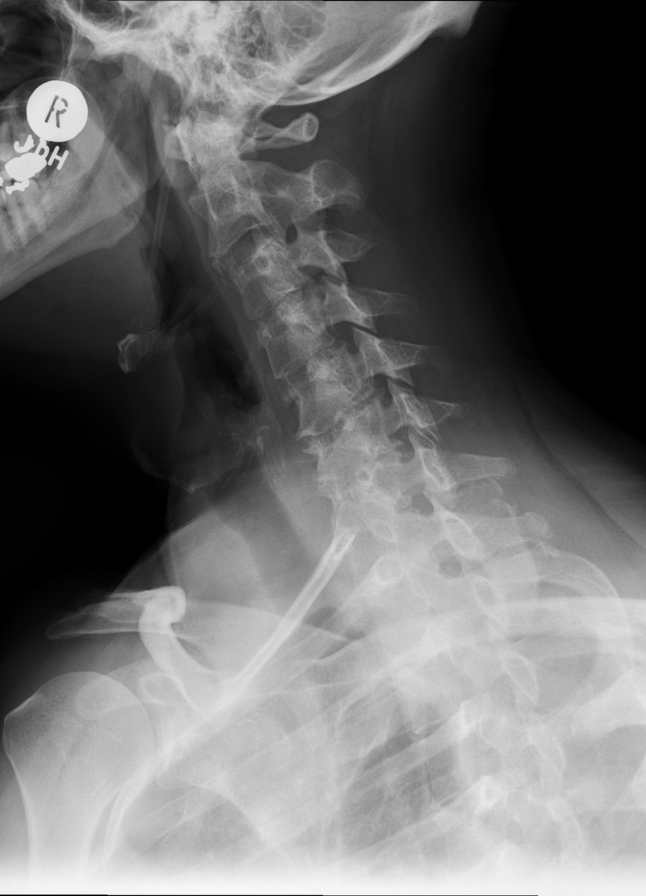

[w cervical spine ap]
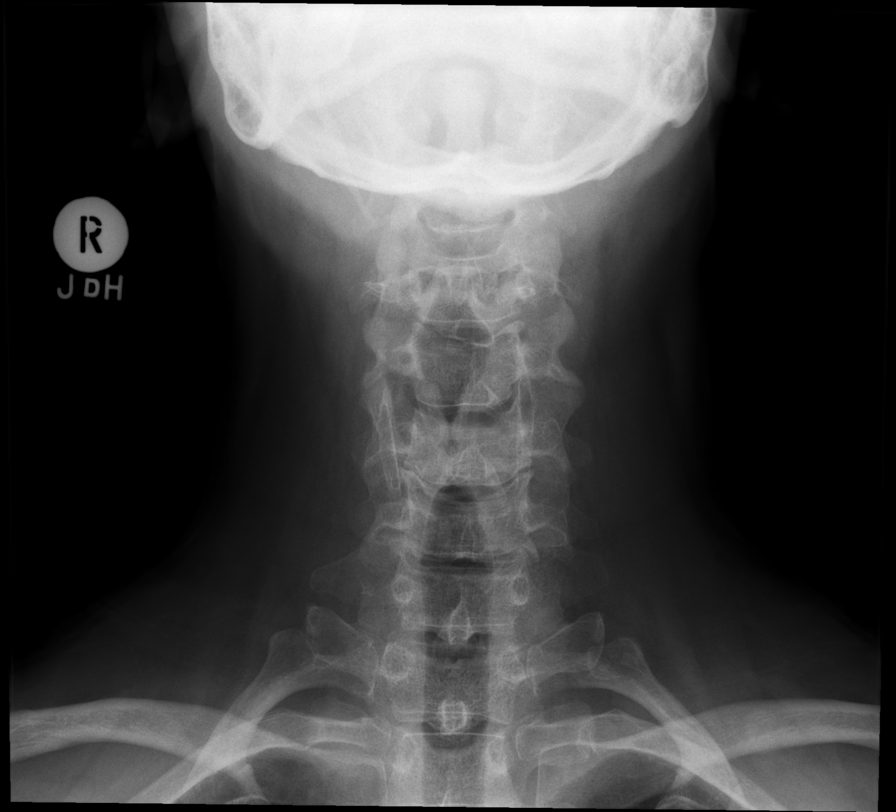

[t cervical spine odontoid]
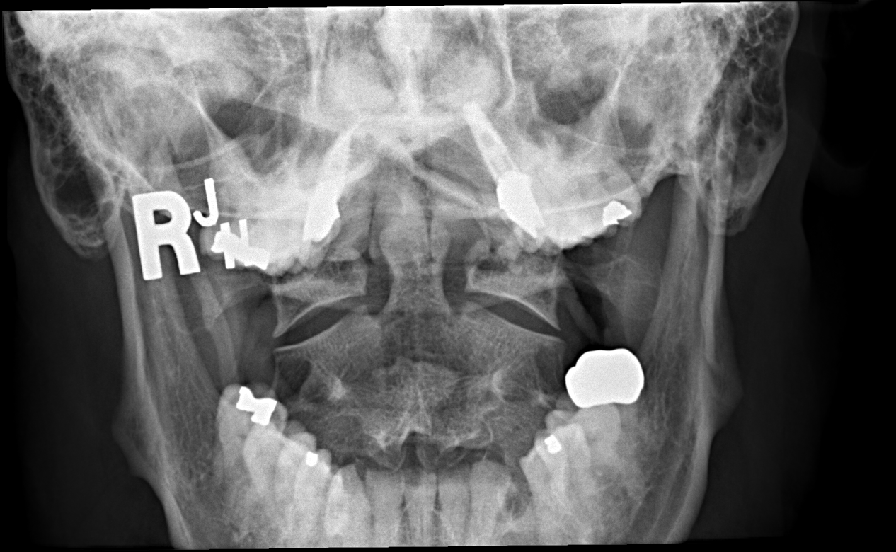

[7 of 7 positions shown; findings below may reference images not displayed]

FINDINGS: Frontal, neutral lateral, flexion lateral, extension lateral,
open-mouth odontoid, and bilateral oblique views were obtained.
There is no evident fracture. There is no demonstrable
spondylolisthesis. Although degree of flexion and extension are
limited, there is no appreciable change in lateral alignment with
flexion-extension. Prevertebral soft tissues and predental space
regions are normal. There is moderately severe disc space narrowing
at C6-7. There is moderate disc space narrowing at C5-6 and C7-T1.
There are anterior osteophytes at C2, C5, C6, and C7. There is
marked exit foraminal narrowing due to bony hypertrophy at C5-6 and
C6-7 bilaterally. Moderate exit foraminal narrowing at C7-T1 is also
noted due to bony hypertrophy. Milder changes of this nature are
noted at C3-4 and C4-5 bilaterally.
IMPRESSION: Osteoarthritic change, most marked at C5-6 and C6-7. No fracture or
spondylolisthesis. No change in lateral alignment with
flexion-extension, although the patient's ability to flex and extend
appearing limited.

## 2016-09-18 ENCOUNTER — Encounter: Payer: Self-pay | Admitting: Internal Medicine

## 2016-09-19 ENCOUNTER — Other Ambulatory Visit: Payer: Self-pay | Admitting: Internal Medicine

## 2016-09-29 ENCOUNTER — Ambulatory Visit: Payer: BLUE CROSS/BLUE SHIELD | Admitting: Internal Medicine

## 2016-10-15 ENCOUNTER — Other Ambulatory Visit: Payer: Self-pay | Admitting: Internal Medicine

## 2016-10-30 ENCOUNTER — Encounter: Payer: Self-pay | Admitting: Internal Medicine

## 2016-10-30 ENCOUNTER — Ambulatory Visit (INDEPENDENT_AMBULATORY_CARE_PROVIDER_SITE_OTHER): Payer: BLUE CROSS/BLUE SHIELD | Admitting: Internal Medicine

## 2016-10-30 VITALS — BP 122/78 | HR 66 | Temp 97.6°F | Resp 14 | Ht 72.0 in | Wt 225.4 lb

## 2016-10-30 DIAGNOSIS — E118 Type 2 diabetes mellitus with unspecified complications: Secondary | ICD-10-CM | POA: Diagnosis not present

## 2016-10-30 DIAGNOSIS — F419 Anxiety disorder, unspecified: Secondary | ICD-10-CM

## 2016-10-30 LAB — BASIC METABOLIC PANEL
BUN: 16 mg/dL (ref 6–23)
CHLORIDE: 98 meq/L (ref 96–112)
CO2: 29 meq/L (ref 19–32)
Calcium: 10.3 mg/dL (ref 8.4–10.5)
Creatinine, Ser: 0.99 mg/dL (ref 0.40–1.50)
GFR: 84.9 mL/min (ref >60.00)
Glucose, Bld: 172 mg/dL — ABNORMAL HIGH (ref 70–99)
Potassium: 4 mEq/L (ref 3.5–5.1)
Sodium: 133 mEq/L — ABNORMAL LOW (ref 135–145)

## 2016-10-30 LAB — HEMOGLOBIN A1C: HEMOGLOBIN A1C: 8.2 % — AB (ref 4.6–6.5)

## 2016-10-30 MED ORDER — PIOGLITAZONE HCL 30 MG PO TABS: 30.0000 mg | ORAL_TABLET | Freq: Every day | ORAL | 6 refills | Status: DC

## 2016-10-30 NOTE — Patient Instructions (Signed)
GO TO THE LAB : Get the blood work     GO TO THE FRONT DESK Schedule your next appointment for a  Check up in 3 months  

## 2016-10-30 NOTE — Progress Notes (Signed)
Subjective:    Patient ID: Gregory Williams., male    DOB: [birth year], 51 y.o.   MRN: [id number]  DOS:  10/30/2016 Type of visit - description : rov Interval history: DM: Since the last visit, has not improved his lifestyle. Diet is okay, he is not active. Reports a lot of stress, related to his children, they are adults but apparently not taking responsibility of their lives. Not taking Actos..   Review of Systems   Past Medical History:  Diagnosis Date  . Diabetes mellitus 2/09  . Dyslipidemia 05/28/2016  . Kidney stones 05/28/2016    Past Surgical History:  Procedure Laterality Date  . Kidney Stone Extraction      Social History   Social History  . Marital status: Married    Spouse name: Pamala Hurry  . Number of children: 3  . Years of education: N/A   Occupational History  . [employer]    Social History Main Topics  . Smoking status: Never Smoker  . Smokeless tobacco: Never Used  . Alcohol use 0.0 oz/week     Comment: socially ; just a few times yearly  . Drug use: No  . Sexual activity: Yes    Partners: Female   Other Topics Concern  . 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 Always use your most recent med list.          canagliflozin 300 MG Tabs tablet Commonly known as:  INVOKANA Take 1 tablet (300 mg total) by mouth daily before breakfast.   metFORMIN 1000 MG tablet Commonly known as:  GLUCOPHAGE Take 1 tablet (1,000 mg total) by mouth 2 (two) times daily with a meal.   ONE TOUCH ULTRA TEST test strip Generic drug:  glucose blood Check blood sugar no more than twice daily.   onetouch ultrasoft lancets Check blood sugar no more than twice daily.   pioglitazone 30 MG tablet Commonly known as:  ACTOS Take 1  tablet (30 mg total) by mouth daily.   sitaGLIPtin 100 MG tablet Commonly known as:  JANUVIA Take 1 tablet (100 mg total) by mouth daily.          Objective:   Physical Exam BP 122/78 (BP Location: Left Arm, Patient Position: Sitting, Cuff Size: Small)   Pulse 66   Temp 97.6 F (36.4 C) (Oral)   Resp 14   Ht 6' (1.829 m)   Wt 225 lb 6 oz (102.2 kg)   SpO2 97%   BMI 30.57 kg/m  General:   Well developed, well nourished . NAD.  HEENT:  Normocephalic . Face symmetric, atraumatic  Skin: Not pale. Not jaundice Neurologic:  alert & oriented X3.  Speech normal, gait appropriate for age and unassisted Psych--  Cognition and judgment appear intact.  Cooperative with normal attention span and concentration.  Behavior appropriate. Slightly anxious but not depressed appearing.      Assessment & Plan:   Assessment DM 2009 Dyslipidemia elevated TG Kidney stones Rectus diastasis Elevated LFTs, first noted 2014, on metformin for years . Hepatitis serology (-) 10-2015  PLAN: DM: Poorly controlled, last A1c 8.0. Since then, he decided not to  proceed with endo referral and his lifestyle continue to be  poor (related to stress, children). He understands perfectly consequences of uncontrolled diabetes. He is taking his medications except Actos, he did not recall he needed to take it. Remains reluctant to take insulin for a number of reasons (does no like needles, sees insulin as "the last step"); had a long conversation about stress, will check the A1c and BMP, he will take all medications including Actos. If A1c is not better, we agreed to improve lifestyle, take all meds as Rx and reassess in 3 months, then he will consider endo Anxiety: counseled, denies need of meds RTC 3 months     Today, I spent more than 30 minutes  min with the patient: >50% of the time counseling regards anxiety, relationship with children, "listening therapy". Also emphasizing the need to take Actos and  explained to him that insulin is not "the last step".

## 2016-10-30 NOTE — Progress Notes (Signed)
Pre visit review using our clinic review tool, if applicable. No additional management support is needed unless otherwise documented below in the visit note. 

## 2016-10-31 NOTE — Assessment & Plan Note (Signed)
DM: Poorly controlled, last A1c 8.0. Since then, he decided not to proceed with endo referral and his lifestyle continue to be  poor (related to stress, children). He understands perfectly consequences of uncontrolled diabetes. He is taking his medications except Actos, he did not recall he needed to take it. Remains reluctant to take insulin for a number of reasons (does no like needles, sees insulin as "the last step"); had a long conversation about stress, will check the A1c and BMP, he will take all medications including Actos. If A1c is not better, we agreed to improve lifestyle, take all meds as Rx and reassess in 3 months, then he will consider endo Anxiety: counseled, denies need of meds RTC 3 months

## 2016-11-17 ENCOUNTER — Other Ambulatory Visit: Payer: Self-pay

## 2016-11-17 MED ORDER — CANAGLIFLOZIN 300 MG PO TABS: 300.0000 mg | ORAL_TABLET | Freq: Every day | ORAL | 0 refills | Status: DC

## 2016-12-25 ENCOUNTER — Other Ambulatory Visit: Payer: Self-pay

## 2016-12-25 MED ORDER — METFORMIN HCL 1000 MG PO TABS: 1000.0000 mg | ORAL_TABLET | Freq: Two times a day (BID) | ORAL | 0 refills | Status: DC

## 2017-01-01 ENCOUNTER — Other Ambulatory Visit: Payer: Self-pay | Admitting: Internal Medicine

## 2017-02-14 ENCOUNTER — Other Ambulatory Visit: Payer: Self-pay | Admitting: Internal Medicine

## 2017-02-25 ENCOUNTER — Encounter: Payer: Self-pay | Admitting: Internal Medicine

## 2017-02-25 ENCOUNTER — Ambulatory Visit: Payer: BLUE CROSS/BLUE SHIELD | Admitting: Internal Medicine

## 2017-02-25 VITALS — BP 122/62 | HR 62 | Temp 97.9°F | Resp 14 | Ht 72.0 in | Wt 194.5 lb

## 2017-02-25 DIAGNOSIS — Z23 Encounter for immunization: Secondary | ICD-10-CM

## 2017-02-25 DIAGNOSIS — R634 Abnormal weight loss: Secondary | ICD-10-CM

## 2017-02-25 DIAGNOSIS — E118 Type 2 diabetes mellitus with unspecified complications: Secondary | ICD-10-CM

## 2017-02-25 DIAGNOSIS — F419 Anxiety disorder, unspecified: Secondary | ICD-10-CM | POA: Diagnosis not present

## 2017-02-25 LAB — BASIC METABOLIC PANEL
BUN: 13 mg/dL (ref 6–23)
CO2: 27 mEq/L (ref 19–32)
Calcium: 10 mg/dL (ref 8.4–10.5)
Chloride: 100 mEq/L (ref 96–112)
Creatinine, Ser: 0.88 mg/dL (ref 0.40–1.50)
GFR: 97.14 mL/min (ref >60.00)
GLUCOSE: 85 mg/dL (ref 70–99)
POTASSIUM: 4.4 meq/L (ref 3.5–5.1)
SODIUM: 136 meq/L (ref 135–145)

## 2017-02-25 LAB — HEMOGLOBIN A1C: Hgb A1c MFr Bld: 5.7 % (ref 4.6–6.5)

## 2017-02-25 LAB — MICROALBUMIN / CREATININE URINE RATIO
Creatinine,U: 103.5 mg/dL
Microalb Creat Ratio: 0.7 mg/g (ref 0.0–30.0)

## 2017-02-25 LAB — TSH: TSH: 1.41 u[IU]/mL (ref 0.35–4.50)

## 2017-02-25 LAB — HM DIABETES EYE EXAM

## 2017-02-25 NOTE — Assessment & Plan Note (Signed)
PLAN: DM: Since the last visit, he is doing great with diet, portion control, exercise.  On Invokana, metformin, Actos and Januvia.  CBGs usually 85-99, occasional sxs of low blood sugar.  Patient hopes we could adjust down his medications based on results.  Will check BMP, A1c, micro.  Rec to carry sugar with him prn low sugars. Weight loss: Significant weight loss since the last time, hopefully related to dietary changes and good medication compliance.  For completeness we will get a TSH. Anxiety : Stress from children decreased, doing better, taking more time for himself.  Other stresses have surface, mother-in-law moved in with them. Flu shot today. RTC 3 or 4 months

## 2017-02-25 NOTE — Patient Instructions (Signed)
GO TO THE LAB : Get the blood work     GO TO THE FRONT DESK Schedule your next appointment for a checkup in 3-4 months   Diabetes: Check your blood sugar  once a day or at least  3-4 times a week  Check your blood sugar  at different times of the day  GOALS: Fasting before a meal 70- 130 2 hours after a meal less than 180   Always carry  sugar when you in case your blood sugar is low

## 2017-02-25 NOTE — Progress Notes (Signed)
Pre visit review using our clinic review tool, if applicable. No additional management support is needed unless otherwise documented below in the visit note. 

## 2017-02-25 NOTE — Progress Notes (Signed)
Subjective:    Patient ID: Gregory Williams., male    DOB: 22-Mar-1966, 51 y.o.   MRN: [id number]  DOS:  02/25/2017 Type of visit - description : f/u, here w/  wife Interval history: DM: Since the last visit, he is taking his medications as prescribed.  Has significantly changed his diet and is exercising more. Occasionally has some presyncope feeling: Weak, nausea, shakiness.  Has not check his CBGs during the episodes but usually eats something and feels better 30 minutes later. Weight loss noted. Anxiety improved Wt Readings from Last 3 Encounters:  02/25/17 194 lb 8 oz (88.2 kg)  10/30/16 225 lb 6 oz (102.2 kg)  05/28/16 221 lb 8 oz (100.5 kg)     Review of Systems  No chest pain or difficulty breathing or palpitations during episodes of feeling weak. Past Medical History:  Diagnosis Date  . Diabetes mellitus 2/09  . Dyslipidemia 05/28/2016  . Kidney stones 05/28/2016    Past Surgical History:  Procedure Laterality Date  . Kidney Stone Extraction      Social History   Socioeconomic History  . Marital status: Married    Spouse name: Pamala Hurry  . Number of children: 3  . Years of education: Not on file  . Highest education level: Not on file  Social Needs  . Financial resource strain: Not on file  . Food insecurity - worry: Not on file  . Food insecurity - inability: Not on file  . Transportation needs - medical: Not on file  . Transportation needs - non-medical: Not on file  Occupational History  . Occupation: Chartered loss adjuster  Tobacco Use  . Smoking status: Never Smoker  . Smokeless tobacco: Never Used  Substance and Sexual Activity  . Alcohol use: Yes    Alcohol/week: 0.0 oz    Comment: socially ; just a few times yearly  . Drug use: No  . Sexual activity: Yes    Partners: Female  Other Topics Concern  . 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 Always use your most recent med list.          canagliflozin 300 MG Tabs tablet Commonly known as:  INVOKANA Take 1 tablet (300 mg total) by mouth daily before breakfast.   metFORMIN 1000 MG tablet Commonly known as:  GLUCOPHAGE Take 1 tablet (1,000 mg total) by mouth 2 (two) times daily with a meal.   ONE TOUCH ULTRA TEST test strip Generic drug:  glucose blood Check blood sugar no more than twice daily.   onetouch ultrasoft lancets Check blood sugar no more than twice daily.   pioglitazone 30 MG tablet Commonly known as:  ACTOS Take 1 tablet (30 mg total) by mouth daily.   sitaGLIPtin 100 MG tablet Commonly known as:  JANUVIA Take 1 tablet (100 mg total) by mouth daily.          Objective:   Physical Exam BP 122/62 (BP Location: Left Arm, Patient Position: Sitting, Cuff Size: Small)   Pulse 62   Temp 97.9 F (36.6 C) (Oral)   Resp 14   Ht 6' (1.829 m)   Wt 194  lb 8 oz (88.2 kg)   SpO2 97%   BMI 26.38 kg/m  General:   Well developed, well nourished . NAD.  HEENT:  Normocephalic . Face symmetric, atraumatic Lungs:  CTA B Normal respiratory effort, no intercostal retractions, no accessory muscle use. Heart: RRR,  no murmur.  No pretibial edema bilaterally  Skin: Not pale. Not jaundice Neurologic:  alert & oriented X3.  Speech normal, gait appropriate for age and unassisted Psych--  Cognition and judgment appear intact.  Cooperative with normal attention span and concentration.  Behavior appropriate. No anxious or depressed appearing.      Assessment & Plan:  Assessment DM 2009 Dyslipidemia elevated TG Kidney stones Rectus diastasis Elevated LFTs, first noted 2014, on metformin for years . Hepatitis serology (-) 10-2015  PLAN: DM: Since the last visit, he is doing great with diet, portion control, exercise.  On Invokana,  metformin, Actos and Januvia.  CBGs usually 85-99, occasional sxs of low blood sugar.  Patient hopes we could adjust down his medications based on results.  Will check BMP, A1c, micro.  Rec to carry sugar with him prn low sugars. Weight loss: Significant weight loss since the last time, hopefully related to dietary changes and good medication compliance.  For completeness we will get a TSH. Anxiety : Stress from children decreased, doing better, taking more time for himself.  Other stresses have surface, mother-in-law moved in with them. Flu shot today. RTC 3 or 4 months

## 2017-03-25 ENCOUNTER — Other Ambulatory Visit: Payer: Self-pay

## 2017-03-25 MED ORDER — PIOGLITAZONE HCL 30 MG PO TABS: 30.0000 mg | ORAL_TABLET | Freq: Every day | ORAL | 1 refills | Status: DC

## 2017-03-25 MED ORDER — METFORMIN HCL 1000 MG PO TABS: 1000.0000 mg | ORAL_TABLET | Freq: Two times a day (BID) | ORAL | 1 refills | Status: DC

## 2017-03-31 ENCOUNTER — Other Ambulatory Visit: Payer: Self-pay | Admitting: Internal Medicine

## 2017-04-01 ENCOUNTER — Telehealth: Payer: Self-pay

## 2017-04-01 NOTE — Telephone Encounter (Signed)
PA initiated via Covermymeds; KEY: [id number]. Awaiting determination.

## 2017-04-03 MED ORDER — EMPAGLIFLOZIN 25 MG PO TABS: 25.0000 mg | ORAL_TABLET | Freq: Every day | ORAL | 3 refills | Status: DC

## 2017-04-03 NOTE — Telephone Encounter (Signed)
Tried calling Pt, no answer, voicemail not set up. Will send MyChart message.

## 2017-04-03 NOTE — Telephone Encounter (Signed)
PA denied. Preferred alternatives: [place] and Jardiance.

## 2017-04-03 NOTE — Telephone Encounter (Signed)
Inform the patient of his insurance decision. I'm ok changing to Jardiance 25 mg 1 p.o. daily , #30, 3 RF If he decided to change, watch for side effects and continue checking his sugar and BPs Definitely needs to be seen by March.

## 2017-04-03 NOTE — Telephone Encounter (Signed)
Invokana 300mg  d/c, and Jardiance 25mg  sent to [facility] in [place].

## 2017-04-29 ENCOUNTER — Encounter: Payer: Self-pay | Admitting: Internal Medicine

## 2017-04-29 MED ORDER — ACCU-CHEK AVIVA PLUS W/DEVICE KIT: PACK | 0 refills | Status: AC

## 2017-04-29 MED ORDER — GLUCOSE BLOOD VI STRP: ORAL_STRIP | 12 refills | Status: DC

## 2017-04-29 MED ORDER — ONETOUCH ULTRASOFT LANCETS MISC: 12 refills | Status: DC

## 2017-04-29 MED ORDER — ACCU-CHEK SOFT TOUCH LANCETS MISC: 12 refills | Status: DC

## 2017-04-29 MED ORDER — ONETOUCH ULTRA BLUE VI STRP: ORAL_STRIP | 12 refills | Status: DC

## 2017-04-29 NOTE — Addendum Note (Signed)
Addended byDamita Dunnings D on: 04/29/2017 09:28 AM   Modules accepted: Orders

## 2017-05-27 ENCOUNTER — Encounter: Payer: Self-pay | Admitting: Internal Medicine

## 2017-05-27 ENCOUNTER — Ambulatory Visit (INDEPENDENT_AMBULATORY_CARE_PROVIDER_SITE_OTHER): Payer: BLUE CROSS/BLUE SHIELD | Admitting: Internal Medicine

## 2017-05-27 VITALS — BP 126/66 | HR 66 | Temp 97.9°F | Resp 14 | Ht 72.0 in | Wt 186.5 lb

## 2017-05-27 DIAGNOSIS — E785 Hyperlipidemia, unspecified: Secondary | ICD-10-CM | POA: Diagnosis not present

## 2017-05-27 DIAGNOSIS — E118 Type 2 diabetes mellitus with unspecified complications: Secondary | ICD-10-CM | POA: Diagnosis not present

## 2017-05-27 LAB — LIPID PANEL
CHOL/HDL RATIO: 4
Cholesterol: 195 mg/dL (ref 0–200)
HDL: 50.5 mg/dL (ref >39.00)
LDL CALC: 118 mg/dL — AB (ref 0–99)
NonHDL: 144.91
TRIGLYCERIDES: 137 mg/dL (ref 0.0–149.0)
VLDL: 27.4 mg/dL (ref 0.0–40.0)

## 2017-05-27 LAB — ALT: ALT: 12 U/L (ref 0–53)

## 2017-05-27 LAB — HEMOGLOBIN A1C: Hgb A1c MFr Bld: 5.4 % (ref 4.6–6.5)

## 2017-05-27 LAB — AST: AST: 14 U/L (ref 0–37)

## 2017-05-27 NOTE — Assessment & Plan Note (Signed)
DM: Doing great with physical activity and  portion control.  Last A1c 5.7, Actos was stopped.  Currently on metformin, Jardiance and Januvia.  Feet exam negative, negative eye exam 3 months ago per patient. Plan: Check a AST, ALT A1c, if A1c satisfactory, will stop Januvia.  Encouraged to keep his great job. Dyslipidemia: Diet controlled, check FLP. RTC 4-5 months

## 2017-05-27 NOTE — Progress Notes (Signed)
Pre visit review using our clinic review tool, if applicable. No additional management support is needed unless otherwise documented below in the visit note. 

## 2017-05-27 NOTE — Addendum Note (Signed)
Addended byDamita Dunnings D on: 05/27/2017 04:23 PM   Modules accepted: Orders

## 2017-05-27 NOTE — Progress Notes (Signed)
Subjective:    Patient ID: Gregory Malecha., male    DOB: 15-Mar-1966, 52 y.o.   MRN: [id number]  DOS:  05/27/2017 Type of visit - description : f/u, here with his wife Interval history: Since his last visit, he is doing great, Actos was stopped  CBGs in the morning 85, he feels great, very rarely feels slightly "shaky".  Wt Readings from Last 3 Encounters:  05/27/17 186 lb 8 oz (84.6 kg)  02/25/17 194 lb 8 oz (88.2 kg)  10/30/16 225 lb 6 oz (102.2 kg)     Review of Systems  Denies lower extremity paresthesias Diet continue to be very good with smaller portions, he remains active Has still some stress but managing it well.  Past Medical History:  Diagnosis Date  . Diabetes mellitus 2/09  . Dyslipidemia 05/28/2016  . Kidney stones 05/28/2016    Past Surgical History:  Procedure Laterality Date  . Kidney Stone Extraction      Social History   Socioeconomic History  . Marital status: Married    Spouse name: Gregory Williams  . Number of children: 3  . Years of education: Not on file  . Highest education level: Not on file  Social Needs  . Financial resource strain: Not on file  . Food insecurity - worry: Not on file  . Food insecurity - inability: Not on file  . Transportation needs - medical: Not on file  . Transportation needs - non-medical: Not on file  Occupational History  . Occupation: Chartered loss adjuster  Tobacco Use  . Smoking status: Never Smoker  . Smokeless tobacco: Never Used  Substance and Sexual Activity  . Alcohol use: Yes    Alcohol/week: 0.0 oz    Comment: socially ; just a few times yearly  . Drug use: No  . Sexual activity: Yes    Partners: Female  Other Topics Concern  . 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 Always use  your most recent med list.          ACCU-CHEK AVIVA PLUS w/Device Kit Check blood sugar no more than twice daily   accu-chek soft touch lancets Check blood sugar no more than twice daily   empagliflozin 25 MG Tabs tablet Commonly known as:  JARDIANCE Take 25 mg by mouth daily.   glucose blood test strip Commonly known as:  ACCU-CHEK AVIVA PLUS Check blood sugar no more than twice daily   metFORMIN 1000 MG tablet Commonly known as:  GLUCOPHAGE Take 1 tablet (1,000 mg total) by mouth 2 (two) times daily with a meal.   sitaGLIPtin 100 MG tablet Commonly known as:  JANUVIA Take 1 tablet (100 mg total) by mouth daily.          Objective:   Physical Exam BP 126/66 (BP Location: Right Arm, Patient Position: Sitting, Cuff Size: Small)   Pulse 66   Temp 97.9 F (36.6 C) (Oral)   Resp 14   Ht 6' (1.829 m)   Wt 186 lb 8 oz (84.6 kg)   SpO2 96%   BMI 25.29 kg/m  General:   Well developed, well nourished . NAD.  HEENT:  Normocephalic . Face symmetric, atraumatic Lungs:  CTA B Normal respiratory effort, no intercostal retractions, no accessory muscle use. Heart: RRR,  no murmur.  No pretibial edema bilaterally  DIABETIC FEET EXAM: No lower extremity edema Normal pedal pulses bilaterally Skin normal, nails normal, no calluses Pinprick examination of the feet normal. Neurologic:  alert & oriented X3.  Speech normal, gait appropriate for age and unassisted Psych--  Cognition and judgment appear intact.  Cooperative with normal attention span and concentration.  Behavior appropriate. No anxious or depressed appearing.      Assessment & Plan:   Assessment DM 2009 Dyslipidemia elevated TG Kidney stones Rectus diastasis Elevated LFTs, first noted 2014, on metformin for years . Hepatitis serology (-) 10-2015  PLAN: DM: Doing great with physical activity and  portion control.  Last A1c 5.7, Actos was stopped.  Currently on metformin, Jardiance and Januvia.  Feet  exam negative, negative eye exam 3 months ago per patient. Plan: Check a AST, ALT A1c, if A1c satisfactory, will stop Januvia.  Encouraged to keep his great job. Dyslipidemia: Diet controlled, check FLP. RTC 4-5 months

## 2017-05-27 NOTE — Patient Instructions (Signed)
GO TO THE LAB : Get the blood work     GO TO THE FRONT DESK Schedule your next appointment for a   checkup in 4-5 months  You are doing great!

## 2017-06-22 ENCOUNTER — Other Ambulatory Visit: Payer: Self-pay

## 2017-06-22 MED ORDER — EMPAGLIFLOZIN 25 MG PO TABS: 25.0000 mg | ORAL_TABLET | Freq: Every day | ORAL | 1 refills | Status: DC

## 2017-06-27 ENCOUNTER — Other Ambulatory Visit: Payer: Self-pay | Admitting: Internal Medicine

## 2017-07-13 ENCOUNTER — Encounter: Payer: Self-pay | Admitting: Gastroenterology

## 2017-09-23 ENCOUNTER — Encounter: Payer: Self-pay | Admitting: Gastroenterology

## 2017-09-26 ENCOUNTER — Other Ambulatory Visit: Payer: Self-pay | Admitting: Internal Medicine

## 2017-10-05 ENCOUNTER — Encounter: Payer: BLUE CROSS/BLUE SHIELD | Admitting: Gastroenterology

## 2017-11-12 ENCOUNTER — Ambulatory Visit: Payer: BLUE CROSS/BLUE SHIELD | Admitting: Internal Medicine

## 2017-11-19 ENCOUNTER — Ambulatory Visit: Payer: BLUE CROSS/BLUE SHIELD | Admitting: Internal Medicine

## 2017-11-19 ENCOUNTER — Encounter: Payer: Self-pay | Admitting: Internal Medicine

## 2017-11-19 VITALS — BP 125/77 | HR 61 | Temp 98.0°F | Resp 16 | Ht 72.0 in | Wt 184.0 lb

## 2017-11-19 DIAGNOSIS — Z125 Encounter for screening for malignant neoplasm of prostate: Secondary | ICD-10-CM

## 2017-11-19 DIAGNOSIS — R399 Unspecified symptoms and signs involving the genitourinary system: Secondary | ICD-10-CM

## 2017-11-19 DIAGNOSIS — E118 Type 2 diabetes mellitus with unspecified complications: Secondary | ICD-10-CM | POA: Diagnosis not present

## 2017-11-19 LAB — BASIC METABOLIC PANEL
BUN: 18 mg/dL (ref 6–23)
CHLORIDE: 98 meq/L (ref 96–112)
CO2: 31 mEq/L (ref 19–32)
Calcium: 10 mg/dL (ref 8.4–10.5)
Creatinine, Ser: 0.96 mg/dL (ref 0.40–1.50)
GFR: 87.6 mL/min (ref >60.00)
Glucose, Bld: 104 mg/dL — ABNORMAL HIGH (ref 70–99)
POTASSIUM: 4.1 meq/L (ref 3.5–5.1)
SODIUM: 135 meq/L (ref 135–145)

## 2017-11-19 LAB — POC URINALSYSI DIPSTICK (AUTOMATED)
Bilirubin, UA: NEGATIVE
GLUCOSE UA: POSITIVE — AB
Ketones, UA: NEGATIVE
Leukocytes, UA: NEGATIVE
NITRITE UA: NEGATIVE
PROTEIN UA: NEGATIVE
RBC UA: NEGATIVE
Spec Grav, UA: 1.02 (ref 1.010–1.025)
UROBILINOGEN UA: 0.2 U/dL
pH, UA: 5 (ref 5.0–8.0)

## 2017-11-19 LAB — CBC
HCT: 49.3 % (ref 39.0–52.0)
HEMOGLOBIN: 16.9 g/dL (ref 13.0–17.0)
MCHC: 34.2 g/dL (ref 30.0–36.0)
MCV: 92.2 fl (ref 78.0–100.0)
PLATELETS: 190 10*3/uL (ref 150.0–400.0)
RBC: 5.35 Mil/uL (ref 4.22–5.81)
RDW: 12.8 % (ref 11.5–15.5)
WBC: 5.3 10*3/uL (ref 4.0–10.5)

## 2017-11-19 LAB — PSA: PSA: 0.75 ng/mL (ref 0.10–4.00)

## 2017-11-19 LAB — HEMOGLOBIN A1C: HEMOGLOBIN A1C: 5.7 % (ref 4.6–6.5)

## 2017-11-19 NOTE — Assessment & Plan Note (Signed)
DM: Last A1c 5.4, Januvia stopped, he continued to do well with diet, exercise.  On metformin and Jardiance.  Check a BMP, A1c.  Ambulatory CBGs remain 95, 100. Dyslipidemia: Last LDL 118, recheck on RTC LUTS: As described above, prostate exam normal.  Will get a CBC, UA, urine culture and a PSA for prostate cancer screening. To have a colonoscopy soon. RTC 6 months, CPX

## 2017-11-19 NOTE — Patient Instructions (Signed)
GO TO THE LAB : Get the blood work     GO TO THE FRONT DESK Schedule your next appointment for a  physical exam in 6 months  

## 2017-11-19 NOTE — Progress Notes (Signed)
Subjective:    Patient ID: Gregory Williams., male    DOB: [birth year], 52 y.o.   MRN: [id number]  DOS:  11/19/2017 Type of visit - description : Follow-up Interval history: DM: Continue to do well with diet and exercise, CBGs well-controlled LUTS: Here lately has noted increasing urinary frequency, goes every 2-3 hours.  At the same time he is trying to keep himself hydrated and drink plenty of fluids.  Wt Readings from Last 3 Encounters:  11/19/17 184 lb (83.5 kg)  05/27/17 186 lb 8 oz (84.6 kg)  02/25/17 194 lb 8 oz (88.2 kg)    Review of Systems Denies fever chills.  No dysuria or gross hematuria. Mild difficulty urinating.  Past Medical History:  Diagnosis Date  . Diabetes mellitus 2/09  . Dyslipidemia 05/28/2016  . Kidney stones 05/28/2016    Past Surgical History:  Procedure Laterality Date  . Kidney Stone Extraction      Social History   Socioeconomic History  . Marital status: Married    Spouse name: Pamala Hurry  . Number of children: 3  . Years of education: Not on file  . Highest education level: Not on file  Occupational History  . Occupation: [employer]  . Financial resource strain: Not on file  . Food insecurity:    Worry: Not on file    Inability: Not on file  . Transportation needs:    Medical: Not on file    Non-medical: Not on file  Tobacco Use  . Smoking status: Never Smoker  . Smokeless tobacco: Never Used  Substance and Sexual Activity  . Alcohol use: Yes    Alcohol/week: 0.0 standard drinks    Comment: socially ; just a few times yearly  . Drug use: No  . Sexual activity: Yes    Partners: Female  Lifestyle  . Physical activity:    Days per week: Not on file    Minutes per session: Not on file  . Stress: Not on file  Relationships  . Social connections:    Talks on phone: Not on file    Gets together: Not on file    Attends religious service: Not on file    Active member of club or organization: Not on file   Attends meetings of clubs or organizations: Not on file    Relationship status: Not on file  . Intimate partner violence:    Fear of current or ex partner: Not on file    Emotionally abused: Not on file    Physically abused: Not on file    Forced sexual activity: Not on file  Other Topics Concern  . 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 Always use your most recent med list.          ACCU-CHEK AVIVA PLUS w/Device Kit Check blood sugar no more than twice daily   accu-chek soft touch lancets Check blood sugar no more than twice daily   empagliflozin 25 MG Tabs tablet Commonly known as:  JARDIANCE Take 25 mg by mouth daily.   glucose blood test strip Check blood sugar no more than twice daily  metFORMIN 1000 MG tablet Commonly known as:  GLUCOPHAGE Take 1 tablet (1,000 mg total) by mouth 2 (two) times daily with a meal.          Objective:   Physical Exam BP 125/77 (BP Location: Right Arm, Patient Position: Sitting, Cuff Size: Small)   Pulse 61   Temp 98 F (36.7 C) (Oral)   Resp 16   Ht 6' (1.829 m)   Wt 184 lb (83.5 kg)   SpO2 100%   BMI 24.95 kg/m     General:   Well developed, NAD, see BMI.  HEENT:  Normocephalic . Face symmetric, atraumatic Lungs:  CTA B Normal respiratory effort, no intercostal retractions, no accessory muscle use. Heart: RRR,  no murmur.  No pretibial edema bilaterally  Skin: Not pale. Not jaundice Rectal: External abnormalities: none. Normal sphincter tone. No rectal masses or tenderness.  No stools found Prostate: Prostate gland firm and smooth, no enlargement, nodularity, tenderness, mass, asymmetry or induration Neurologic:  alert & oriented X3.  Speech normal, gait appropriate for age and  unassisted Psych--  Cognition and judgment appear intact.  Cooperative with normal attention span and concentration.  Behavior appropriate. No anxious or depressed appearing.       Assessment & Plan:   Assessment DM 2009 Dyslipidemia elevated TG Kidney stones Rectus diastasis Elevated LFTs, first noted 2014, on metformin for years . Hepatitis serology (-) 10-2015  PLAN: DM: Last A1c 5.4, Januvia stopped, he continued to do well with diet, exercise.  On metformin and Jardiance.  Check a BMP, A1c.  Ambulatory CBGs remain 95, 100. Dyslipidemia: Last LDL 118, recheck on RTC LUTS: As described above, prostate exam normal.  Will get a CBC, UA, urine culture and a PSA for prostate cancer screening. To have a colonoscopy soon. RTC 6 months, CPX  

## 2017-11-20 LAB — URINE CULTURE
MICRO NUMBER:: 91035468
SPECIMEN QUALITY:: ADEQUATE

## 2017-11-27 ENCOUNTER — Ambulatory Visit (AMBULATORY_SURGERY_CENTER): Payer: Self-pay

## 2017-11-27 ENCOUNTER — Encounter: Payer: Self-pay | Admitting: Gastroenterology

## 2017-11-27 VITALS — Ht 72.0 in | Wt 183.6 lb

## 2017-11-27 DIAGNOSIS — Z860101 Personal history of adenomatous and serrated colon polyps: Secondary | ICD-10-CM

## 2017-11-27 DIAGNOSIS — Z83719 Family history of colon polyps, unspecified: Secondary | ICD-10-CM

## 2017-11-27 DIAGNOSIS — Z86010 Personal history of colonic polyps: Secondary | ICD-10-CM

## 2017-11-27 DIAGNOSIS — Z8371 Family history of colonic polyps: Secondary | ICD-10-CM

## 2017-11-27 MED ORDER — PEG 3350-KCL-NA BICARB-NACL 420 G PO SOLR: 4000.0000 mL | Freq: Once | ORAL | 0 refills | Status: AC

## 2017-11-27 NOTE — Progress Notes (Signed)
Per pt, no allergies to soy or egg products.Pt not taking any weight loss meds or using  O2 at home.  Pt refused emmi video. 

## 2017-12-11 ENCOUNTER — Ambulatory Visit (AMBULATORY_SURGERY_CENTER): Payer: BLUE CROSS/BLUE SHIELD | Admitting: Gastroenterology

## 2017-12-11 ENCOUNTER — Encounter: Payer: Self-pay | Admitting: Gastroenterology

## 2017-12-11 VITALS — BP 101/56 | HR 66 | Temp 97.8°F | Resp 10 | Ht 72.0 in | Wt 184.0 lb

## 2017-12-11 DIAGNOSIS — Z860100 Personal history of colon polyps, unspecified: Secondary | ICD-10-CM

## 2017-12-11 MED ORDER — SODIUM CHLORIDE 0.9 % IV SOLN: 500.0000 mL | INTRAVENOUS | Status: DC

## 2017-12-11 NOTE — Patient Instructions (Signed)
YOU HAD AN ENDOSCOPIC PROCEDURE TODAY AT THE [facility]:   Refer to the procedure report that was given to you for any specific questions about what was found during the examination.  If the procedure report does not answer your questions, please call your gastroenterologist to clarify.  If you requested that your care partner not be given the details of your procedure findings, then the procedure report has been included in a sealed envelope for you to review at your convenience later.  YOU SHOULD EXPECT: Some feelings of bloating in the abdomen. Passage of more gas than usual.  Walking can help get rid of the air that was put into your GI tract during the procedure and reduce the bloating. If you had a lower endoscopy (such as a colonoscopy or flexible sigmoidoscopy) you may notice spotting of blood in your stool or on the toilet paper. If you underwent a bowel prep for your procedure, you may not have a normal bowel movement for a few days.  Please Note:  You might notice some irritation and congestion in your nose or some drainage.  This is from the oxygen used during your procedure.  There is no need for concern and it should clear up in a day or so.  SYMPTOMS TO REPORT IMMEDIATELY:   Following lower endoscopy (colonoscopy or flexible sigmoidoscopy):  Excessive amounts of blood in the stool  Significant tenderness or worsening of abdominal pains  Swelling of the abdomen that is new, acute  Fever of 100F or higher  For urgent or emergent issues, a gastroenterologist can be reached at any hour by calling [phone].   DIET:  We do recommend a small meal at first, but then you may proceed to your regular diet.  Drink plenty of fluids but you should avoid alcoholic beverages for 24 hours.  ACTIVITY:  You should plan to take it easy for the rest of today and you should NOT DRIVE or use heavy machinery until tomorrow (because of the sedation medicines used during the test).     FOLLOW UP: Our staff will call the number listed on your records the next business day following your procedure to check on you and address any questions or concerns that you may have regarding the information given to you following your procedure. If we do not reach you, we will leave a message.  However, if you are feeling well and you are not experiencing any problems, there is no need to return our call.  We will assume that you have returned to your regular daily activities without incident.  If any biopsies were taken you will be contacted by phone or by letter within the next 1-3 weeks.  Please call us at [phone] if you have not heard about the biopsies in 3 weeks.    SIGNATURES/CONFIDENTIALITY: You and/or your care partner have signed paperwork which will be entered into your electronic medical record.  These signatures attest to the fact that that the information above on your After Visit Summary has been reviewed and is understood.  Full responsibility of the confidentiality of this discharge information lies with you and/or your care-partner. 

## 2017-12-11 NOTE — Op Note (Signed)
[facility] Patient Name: Sora Olivo Procedure Date: 12/11/2017 7:51 AM MRN: [id number] Endoscopist: Milus Banister , MD Age: 52 Referring MD:  Date of Birth: [birth year] Gender: Male Account #: [id number] Procedure:                Colonoscopy Indications:              High risk colon cancer surveillance: Personal                            history of colonic polyps; Colonoscopy Dr. Ardis Hughs                            05/2014 found 2 pedunculated sigmoid polyps (3cm and                            1.5cm). Pathology from one of the polyps suggested                            adenomatous tissue at cautery margin. Flex sig Dr.                            Ardis Hughs 09/2014 site was normal. Medicines:                Monitored Anesthesia Care Procedure:                Pre-Anesthesia Assessment:                           - Prior to the procedure, a History and Physical                            was performed, and patient medications and                            allergies were reviewed. The patient's tolerance of                            previous anesthesia was also reviewed. The risks                            and benefits of the procedure and the sedation                            options and risks were discussed with the patient.                            All questions were answered, and informed consent                            was obtained. Prior Anticoagulants: The patient has                            taken no previous anticoagulant or antiplatelet  agents. ASA Grade Assessment: II - A patient with                            mild systemic disease. After reviewing the risks                            and benefits, the patient was deemed in                            satisfactory condition to undergo the procedure.                           After obtaining informed consent, the colonoscope                            was passed under direct vision.  Throughout the                            procedure, the patient's blood pressure, pulse, and                            oxygen saturations were monitored continuously. The                            Colonoscope was introduced through the anus and                            advanced to the the cecum, identified by                            appendiceal orifice and ileocecal valve. The                            colonoscopy was performed without difficulty. The                            patient tolerated the procedure well. The quality                            of the bowel preparation was excellent. The                            ileocecal valve, appendiceal orifice, and rectum                            were photographed. Scope In: 7:57:50 AM Scope Out: 8:19:01 AM Scope Withdrawal Time: 0 hours 18 minutes 45 seconds  Total Procedure Duration: 0 hours 21 minutes 11 seconds  Findings:                 The entire examined colon appeared normal on direct                            and retroflexion views.  The site of 2016 polypectomies, evident by previous                            Spot tattoo, was free of adenomatous mucosa. Complications:            No immediate complications. Estimated blood loss:                            None. Estimated Blood Loss:     Estimated blood loss: none. Impression:               - The entire examined colon is normal on direct and                            retroflexion views.                           - The site of 2016 polypectomies, evident by                            previous Spot tattoo, was free of adenomatous                            mucosa. Recommendation:           - Patient has a contact number available for                            emergencies. The signs and symptoms of potential                            delayed complications were discussed with the                            patient. Return to normal activities  tomorrow.                            Written discharge instructions were provided to the                            patient.                           - Resume previous diet.                           - Continue present medications.                           - Repeat colonoscopy in 5 years for surveillance                            (HRA 2016). Milus Banister, MD 12/11/2017 8:24:25 AM This report has been signed electronically.

## 2017-12-11 NOTE — [facility] Notes (Signed)
Report to PACU, RN, vss, BBS= Clear.  

## 2017-12-14 ENCOUNTER — Telehealth: Payer: Self-pay

## 2017-12-14 NOTE — Telephone Encounter (Signed)
  Follow up Call-  Call back number 12/11/2017  Post procedure Call Back phone  # [phone]  Permission to leave phone message Yes  Some recent data might be hidden     Patient questions:  Do you have a fever, pain , or abdominal swelling? No. Pain Score  0 *  Have you tolerated food without any problems? Yes.    Have you been able to return to your normal activities? Yes.    Do you have any questions about your discharge instructions: Diet   No. Medications  No. Follow up visit  No.  Do you have questions or concerns about your Care? No.  Actions: * If pain score is 4 or above: No action needed, pain <4.  No problems noted per pt. maw

## 2017-12-27 ENCOUNTER — Other Ambulatory Visit: Payer: Self-pay | Admitting: Internal Medicine

## 2018-02-22 ENCOUNTER — Ambulatory Visit (INDEPENDENT_AMBULATORY_CARE_PROVIDER_SITE_OTHER): Payer: BLUE CROSS/BLUE SHIELD | Admitting: Internal Medicine

## 2018-02-22 ENCOUNTER — Encounter: Payer: Self-pay | Admitting: Internal Medicine

## 2018-02-22 VITALS — BP 122/74 | HR 67 | Temp 98.1°F | Resp 16 | Ht 72.0 in | Wt 191.5 lb

## 2018-02-22 DIAGNOSIS — E785 Hyperlipidemia, unspecified: Secondary | ICD-10-CM | POA: Diagnosis not present

## 2018-02-22 DIAGNOSIS — N529 Male erectile dysfunction, unspecified: Secondary | ICD-10-CM

## 2018-02-22 DIAGNOSIS — E1169 Type 2 diabetes mellitus with other specified complication: Secondary | ICD-10-CM | POA: Diagnosis not present

## 2018-02-22 DIAGNOSIS — Z0000 Encounter for general adult medical examination without abnormal findings: Secondary | ICD-10-CM

## 2018-02-22 DIAGNOSIS — Z23 Encounter for immunization: Secondary | ICD-10-CM | POA: Diagnosis not present

## 2018-02-22 DIAGNOSIS — N486 Induration penis plastica: Secondary | ICD-10-CM

## 2018-02-22 LAB — HM DIABETES EYE EXAM

## 2018-02-22 NOTE — Patient Instructions (Addendum)
  GO TO THE FRONT DESK Schedule your next appointment for a checkup in 4 months  Schedule labs to be done this week, fasting   peyronie's disease

## 2018-02-22 NOTE — Assessment & Plan Note (Addendum)
Td 2013, prevnar 2016, pnm 23: 05-2016; flu shot today;  shingrex discussed  Prostate Ca screening: +FH, father age 51;   DRE wnl today, PSA  Few months ago wnl CCS: FH colon ca- GM FH colon polyps- mother  Cscope 05-2014, flex sig 09-2014 ok, s/p cscope 11-2017 Labs: Will come back fasting for CMP, FLP, A1c Diet and exercise discussed

## 2018-02-22 NOTE — Progress Notes (Signed)
Pre visit review using our clinic review tool, if applicable. No additional management support is needed unless otherwise documented below in the visit note. 

## 2018-02-22 NOTE — Progress Notes (Signed)
 Subjective:    Patient ID: Gregory H Heckard Jr., male    DOB: 07/05/1965, 52 y.o.   MRN: [id number]  DOS:  02/22/2018 Type of visit - description : CPX Here for CPX, we addressed other issues Wt Readings from Last 3 Encounters:  02/22/18 191 lb 8 oz (86.9 kg)  12/11/17 184 lb (83.5 kg)  11/27/17 183 lb 9.6 oz (83.3 kg)     Review of Systems Long history of erectile  dysfunction, we never talked about this before but he reports that today. Also, has noted a hard plaque  at the penis few months ago.  Not painful.  Other than above, a 14 point review of systems is negative      Past Medical History:  Diagnosis Date  . Diabetes mellitus 2/09  . Dyslipidemia 05/28/2016  . Kidney stones 05/28/2016    Past Surgical History:  Procedure Laterality Date  . COLONOSCOPY    . Kidney Stone Extraction    . POLYPECTOMY      Social History   Socioeconomic History  . Marital status: Married    Spouse name: Barbara  . Number of children: 3  . Years of education: Not on file  . Highest education level: Not on file  Occupational History  . Occupation: [employer]  Social Needs  . Financial resource strain: Not on file  . Food insecurity:    Worry: Not on file    Inability: Not on file  . Transportation needs:    Medical: Not on file    Non-medical: Not on file  Tobacco Use  . Smoking status: Never Smoker  . Smokeless tobacco: Never Used  Substance and Sexual Activity  . Alcohol use: Not Currently    Alcohol/week: 0.0 standard drinks  . Drug use: No  . Sexual activity: Yes    Partners: Female  Lifestyle  . Physical activity:    Days per week: Not on file    Minutes per session: Not on file  . Stress: Not on file  Relationships  . Social connections:    Talks on phone: Not on file    Gets together: Not on file    Attends religious service: Not on file    Active member of club or organization: Not on file    Attends meetings of clubs or organizations: Not on file   Relationship status: Not on file  . Intimate partner violence:    Fear of current or ex partner: Not on file    Emotionally abused: Not on file    Physically abused: Not on file    Forced sexual activity: Not on file  Other Topics Concern  . 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 Prostate cancer Father 75  . Diabetes Father   . Parkinsonism Father   . Colon polyps Mother        in her 60s  . Hypertension Mother   . Colon cancer Maternal Grandmother        dx late in life  . Diabetes Unknown        father family  . Diabetes Paternal Aunt   . Stroke Neg Hx   . Heart attack Neg Hx   . Esophageal cancer Neg Hx   . Rectal cancer Neg Hx   .   Stomach cancer Neg Hx      Allergies as of 02/22/2018   No Known Allergies     Medication List        Accurate as of 02/22/18 11:59 PM. Always use your most recent med list.          ACCU-CHEK AVIVA PLUS w/Device Kit Check blood sugar no more than twice daily   accu-chek soft touch lancets Check blood sugar no more than twice daily   empagliflozin 25 MG Tabs tablet Commonly known as:  JARDIANCE Take 25 mg by mouth daily.   glucose blood test strip Check blood sugar no more than twice daily   metFORMIN 1000 MG tablet Commonly known as:  GLUCOPHAGE Take 1 tablet (1,000 mg total) by mouth 2 (two) times daily with a meal.   multivitamin tablet Take 1 tablet by mouth daily.   vitamin C 500 MG tablet Commonly known as:  ASCORBIC ACID Take 500 mg by mouth daily.           Objective:   Physical Exam BP 122/74 (BP Location: Left Arm, Patient Position: Sitting, Cuff Size: Small)   Pulse 67   Temp 98.1 F (36.7 C) (Oral)   Resp 16   Ht 6' (1.829 m)   Wt 191 lb 8 oz (86.9 kg)   SpO2 97%   BMI 25.97 kg/m  General: Well developed, NAD, BMI noted Neck: No   thyromegaly  HEENT:  Normocephalic . Face symmetric, atraumatic Lungs:  CTA B Normal respiratory effort, no intercostal retractions, no accessory muscle use. Heart: RRR,  no murmur.  No pretibial edema bilaterally  Abdomen:  Not distended, soft, non-tender. No rebound or rigidity.   Rectal: External abnormalities: none. Normal sphincter tone. No rectal masses or tenderness.  Brown stools Prostate: Prostate gland firm and smooth, no enlargement, nodularity, tenderness, mass, asymmetry or induration Penis: Has firm plaque at the dorsum.  Testicles normal. Skin: Exposed areas without rash. Not pale. Not jaundice Neurologic:  alert & oriented X3.  Speech normal, gait appropriate for age and unassisted Strength symmetric and appropriate for age.  Psych: Cognition and judgment appear intact.  Cooperative with normal attention span and concentration.  Behavior appropriate. No anxious or depressed appearing.     Assessment & Plan:   Assessment DM 2009 Dyslipidemia elevated TG Kidney stones Rectus diastasis Elevated LFTs, first noted 2014, on metformin for years . Hepatitis serology (-) 10-2015  PLAN: DM, dyslipidemia: Has gained some weight but is getting back on a healthy lifestyle, he actually walks 5 miles daily.  Will come back fasting for blood work. ED and Peyronie's disease: both new dxs, refer to urology RTC 4 months 

## 2018-02-23 ENCOUNTER — Other Ambulatory Visit (INDEPENDENT_AMBULATORY_CARE_PROVIDER_SITE_OTHER): Payer: BLUE CROSS/BLUE SHIELD

## 2018-02-23 DIAGNOSIS — E1169 Type 2 diabetes mellitus with other specified complication: Secondary | ICD-10-CM | POA: Diagnosis not present

## 2018-02-23 DIAGNOSIS — Z0000 Encounter for general adult medical examination without abnormal findings: Secondary | ICD-10-CM

## 2018-02-23 DIAGNOSIS — E785 Hyperlipidemia, unspecified: Secondary | ICD-10-CM

## 2018-02-23 LAB — LIPID PANEL
Cholesterol: 203 mg/dL — ABNORMAL HIGH (ref 0–200)
HDL: 49.7 mg/dL (ref >39.00)
LDL Cholesterol: 117 mg/dL — ABNORMAL HIGH (ref 0–99)
NonHDL: 152.98
Total CHOL/HDL Ratio: 4
Triglycerides: 182 mg/dL — ABNORMAL HIGH (ref 0.0–149.0)
VLDL: 36.4 mg/dL (ref 0.0–40.0)

## 2018-02-23 LAB — COMPREHENSIVE METABOLIC PANEL
ALT: 14 U/L (ref 0–53)
AST: 14 U/L (ref 0–37)
Albumin: 4.8 g/dL (ref 3.5–5.2)
Alkaline Phosphatase: 41 U/L (ref 39–117)
BUN: 16 mg/dL (ref 6–23)
CALCIUM: 10.1 mg/dL (ref 8.4–10.5)
CO2: 30 mEq/L (ref 19–32)
Chloride: 100 mEq/L (ref 96–112)
Creatinine, Ser: 0.86 mg/dL (ref 0.40–1.50)
GFR: 99.36 mL/min (ref >60.00)
Glucose, Bld: 109 mg/dL — ABNORMAL HIGH (ref 70–99)
Potassium: 4.1 mEq/L (ref 3.5–5.1)
Sodium: 137 mEq/L (ref 135–145)
Total Bilirubin: 1.4 mg/dL — ABNORMAL HIGH (ref 0.2–1.2)
Total Protein: 7.4 g/dL (ref 6.0–8.3)

## 2018-02-23 LAB — HEMOGLOBIN A1C: Hgb A1c MFr Bld: 5.7 % (ref 4.6–6.5)

## 2018-02-23 NOTE — Assessment & Plan Note (Signed)
DM, dyslipidemia: Has gained some weight but is getting back on a healthy lifestyle, he actually walks 5 miles daily.  Will come back fasting for blood work. ED and Peyronie's disease: both new dxs, refer to urology RTC 4 months

## 2018-02-25 ENCOUNTER — Encounter: Payer: Self-pay | Admitting: Internal Medicine

## 2018-02-26 MED ORDER — ATORVASTATIN CALCIUM 20 MG PO TABS: 20.0000 mg | ORAL_TABLET | Freq: Every day | ORAL | 3 refills | Status: DC

## 2018-02-26 NOTE — Addendum Note (Signed)
Addended byDamita Dunnings D on: 02/26/2018 07:40 AM   Modules accepted: Orders

## 2018-03-28 ENCOUNTER — Other Ambulatory Visit: Payer: Self-pay | Admitting: Internal Medicine

## 2018-03-30 ENCOUNTER — Other Ambulatory Visit: Payer: Self-pay | Admitting: Internal Medicine

## 2018-05-17 ENCOUNTER — Encounter: Payer: Self-pay | Admitting: Internal Medicine

## 2018-05-30 ENCOUNTER — Encounter: Payer: Self-pay | Admitting: Internal Medicine

## 2018-05-31 MED ORDER — METFORMIN HCL 1000 MG PO TABS: 1000.0000 mg | ORAL_TABLET | Freq: Two times a day (BID) | ORAL | 1 refills | Status: DC

## 2018-06-08 ENCOUNTER — Ambulatory Visit: Payer: BLUE CROSS/BLUE SHIELD | Admitting: Internal Medicine

## 2018-06-25 ENCOUNTER — Other Ambulatory Visit: Payer: Self-pay | Admitting: Internal Medicine

## 2018-09-07 DIAGNOSIS — N5201 Erectile dysfunction due to arterial insufficiency: Secondary | ICD-10-CM | POA: Diagnosis not present

## 2018-09-07 DIAGNOSIS — N486 Induration penis plastica: Secondary | ICD-10-CM | POA: Diagnosis not present

## 2018-09-21 ENCOUNTER — Other Ambulatory Visit: Payer: Self-pay | Admitting: Internal Medicine

## 2018-09-23 DIAGNOSIS — N529 Male erectile dysfunction, unspecified: Secondary | ICD-10-CM | POA: Insufficient documentation

## 2018-09-23 DIAGNOSIS — N486 Induration penis plastica: Secondary | ICD-10-CM | POA: Insufficient documentation

## 2018-11-21 ENCOUNTER — Other Ambulatory Visit: Payer: Self-pay | Admitting: Internal Medicine

## 2018-11-22 MED ORDER — JARDIANCE 25 MG PO TABS: 25.0000 mg | ORAL_TABLET | Freq: Every day | ORAL | 1 refills | Status: DC

## 2018-11-22 MED ORDER — ATORVASTATIN CALCIUM 20 MG PO TABS: 20.0000 mg | ORAL_TABLET | Freq: Every day | ORAL | 1 refills | Status: DC

## 2019-02-24 ENCOUNTER — Telehealth: Payer: Self-pay | Admitting: *Deleted

## 2019-02-24 DIAGNOSIS — Z0000 Encounter for general adult medical examination without abnormal findings: Secondary | ICD-10-CM

## 2019-02-24 DIAGNOSIS — E1169 Type 2 diabetes mellitus with other specified complication: Secondary | ICD-10-CM

## 2019-02-24 DIAGNOSIS — E785 Hyperlipidemia, unspecified: Secondary | ICD-10-CM

## 2019-02-24 NOTE — Telephone Encounter (Signed)
CMP, FLP, CBC, A1c, TSH,

## 2019-02-24 NOTE — Telephone Encounter (Signed)
Copied from [place] [id number]. Topic: General - Other >> Feb 24, 2019 10:58 AM Oneta Rack wrote: Reason for CRM: patient requesting pre visit labs prior to physical due to patient being diabetic and unable to fast all day. Patient would like to come in tomorrow morning for labs, patient appointment is tomorrow afternoon, best # [phone]

## 2019-02-24 NOTE — Telephone Encounter (Signed)
Please advise 

## 2019-02-24 NOTE — Telephone Encounter (Signed)
Orders placed.

## 2019-02-25 ENCOUNTER — Other Ambulatory Visit: Payer: Self-pay

## 2019-02-25 ENCOUNTER — Ambulatory Visit (INDEPENDENT_AMBULATORY_CARE_PROVIDER_SITE_OTHER): Payer: BC Managed Care – PPO | Admitting: Internal Medicine

## 2019-02-25 ENCOUNTER — Encounter: Payer: Self-pay | Admitting: Internal Medicine

## 2019-02-25 VITALS — BP 115/74 | HR 70 | Temp 97.2°F | Resp 16 | Ht 72.0 in | Wt 201.0 lb

## 2019-02-25 DIAGNOSIS — Z0000 Encounter for general adult medical examination without abnormal findings: Secondary | ICD-10-CM | POA: Diagnosis not present

## 2019-02-25 DIAGNOSIS — E1169 Type 2 diabetes mellitus with other specified complication: Secondary | ICD-10-CM | POA: Diagnosis not present

## 2019-02-25 DIAGNOSIS — Z23 Encounter for immunization: Secondary | ICD-10-CM

## 2019-02-25 DIAGNOSIS — E785 Hyperlipidemia, unspecified: Secondary | ICD-10-CM

## 2019-02-25 LAB — CBC WITH DIFFERENTIAL/PLATELET
Basophils Absolute: 0 10*3/uL (ref 0.0–0.1)
Basophils Relative: 0.5 % (ref 0.0–3.0)
Eosinophils Absolute: 0.1 10*3/uL (ref 0.0–0.7)
Eosinophils Relative: 1.9 % (ref 0.0–5.0)
HCT: 48.6 % (ref 39.0–52.0)
Hemoglobin: 16.4 g/dL (ref 13.0–17.0)
Lymphocytes Relative: 41.7 % (ref 12.0–46.0)
Lymphs Abs: 2.9 10*3/uL (ref 0.7–4.0)
MCHC: 33.7 g/dL (ref 30.0–36.0)
MCV: 92.6 fl (ref 78.0–100.0)
Monocytes Absolute: 0.5 10*3/uL (ref 0.1–1.0)
Monocytes Relative: 7.5 % (ref 3.0–12.0)
Neutro Abs: 3.4 10*3/uL (ref 1.4–7.7)
Neutrophils Relative %: 48.4 % (ref 43.0–77.0)
Platelets: 208 10*3/uL (ref 150.0–400.0)
RBC: 5.25 Mil/uL (ref 4.22–5.81)
RDW: 12.7 % (ref 11.5–15.5)
WBC: 7 10*3/uL (ref 4.0–10.5)

## 2019-02-25 LAB — MICROALBUMIN / CREATININE URINE RATIO
Creatinine,U: 87.9 mg/dL
Microalb Creat Ratio: 0.8 mg/g (ref 0.0–30.0)
Microalb, Ur: <0.7 mg/dL (ref 0.0–1.9)

## 2019-02-25 LAB — HEMOGLOBIN A1C: Hgb A1c MFr Bld: 5.8 % (ref 4.6–6.5)

## 2019-02-25 LAB — COMPREHENSIVE METABOLIC PANEL
ALT: 14 U/L (ref 0–53)
AST: 15 U/L (ref 0–37)
Albumin: 4.7 g/dL (ref 3.5–5.2)
Alkaline Phosphatase: 46 U/L (ref 39–117)
BUN: 14 mg/dL (ref 6–23)
CO2: 27 mEq/L (ref 19–32)
Calcium: 9.8 mg/dL (ref 8.4–10.5)
Chloride: 98 mEq/L (ref 96–112)
Creatinine, Ser: 0.9 mg/dL (ref 0.40–1.50)
GFR: 88.36 mL/min (ref >60.00)
Glucose, Bld: 99 mg/dL (ref 70–99)
Potassium: 4.1 mEq/L (ref 3.5–5.1)
Sodium: 136 mEq/L (ref 135–145)
Total Bilirubin: 1 mg/dL (ref 0.2–1.2)
Total Protein: 7.2 g/dL (ref 6.0–8.3)

## 2019-02-25 LAB — LIPID PANEL
Cholesterol: 201 mg/dL — ABNORMAL HIGH (ref 0–200)
HDL: 46.6 mg/dL (ref >39.00)
NonHDL: 154.54
Total CHOL/HDL Ratio: 4
Triglycerides: 334 mg/dL — ABNORMAL HIGH (ref 0.0–149.0)
VLDL: 66.8 mg/dL — ABNORMAL HIGH (ref 0.0–40.0)

## 2019-02-25 LAB — TSH: TSH: 1.83 u[IU]/mL (ref 0.35–4.50)

## 2019-02-25 LAB — LDL CHOLESTEROL, DIRECT: Direct LDL: 110 mg/dL

## 2019-02-25 NOTE — Progress Notes (Signed)
Subjective:    Patient ID: Gregory Gintz., male    DOB: 1965/06/22, 53 y.o.   MRN: [id number]  DOS:  02/25/2019 Type of visit - description: CPX No major concerns, weight has increased, he thinks is because he is not as active as before.  Diet remains very good.  Wt Readings from Last 3 Encounters:  02/25/19 201 lb (91.2 kg)  02/22/18 191 lb 8 oz (86.9 kg)  12/11/17 184 lb (83.5 kg)     Review of Systems   A 14 point review of systems is negative   Past Medical History:  Diagnosis Date  . Diabetes mellitus 2/09  . Dyslipidemia 05/28/2016  . Erectile dysfunction   . Kidney stones 05/28/2016  . Peyronie's disease     Past Surgical History:  Procedure Laterality Date  . COLONOSCOPY    . Kidney Stone Extraction    . POLYPECTOMY      Social History   Socioeconomic History  . Marital status: Married    Spouse name: Pamala Hurry  . Number of children: 3  . Years of education: Not on file  . Highest education level: Not on file  Occupational History  . Occupation: [employer]  . Financial resource strain: Not on file  . Food insecurity    Worry: Not on file    Inability: Not on file  . Transportation needs    Medical: Not on file    Non-medical: Not on file  Tobacco Use  . Smoking status: Never Smoker  . Smokeless tobacco: Never Used  Substance and Sexual Activity  . Alcohol use: Not Currently    Alcohol/week: 0.0 standard drinks  . Drug use: No  . Sexual activity: Yes    Partners: Female  Lifestyle  . Physical activity    Days per week: Not on file    Minutes per session: Not on file  . Stress: Not on file  Relationships  . Social Herbalist on phone: Not on file    Gets together: Not on file    Attends religious service: Not on file    Active member of club or organization: Not on file    Attends meetings of clubs or organizations: Not on file    Relationship status: Not on file  . Intimate partner violence    Fear of  current or ex partner: Not on file    Emotionally abused: Not on file    Physically abused: Not on file    Forced sexual activity: Not on file  Other Topics Concern  . 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 Prostate cancer Father 82  . Diabetes Father   . Parkinsonism Father   . Colon polyps Mother        in her 47s  . Hypertension Mother   . Breast cancer Mother   . Colon cancer Maternal Grandmother        dx late in life  . Diabetes Other        father family  . Diabetes Paternal Aunt   . Stroke Neg Hx   . Heart attack Neg Hx   . Esophageal cancer Neg Hx   . Rectal  cancer Neg Hx   . Stomach cancer Neg Hx      Allergies as of 02/25/2019   No Known Allergies     Medication List       Accurate as of February 25, 2019 11:59 PM. If you have any questions, ask your nurse or doctor.        STOP taking these medications   atorvastatin 20 MG tablet Commonly known as: LIPITOR Stopped by: Kathlene November, MD     TAKE these medications   Accu-Chek Aviva Plus w/Device Kit Check blood sugar no more than twice daily   accu-chek soft touch lancets Check blood sugar no more than twice daily   glucose blood test strip Commonly known as: Accu-Chek Aviva Plus Check blood sugar no more than twice daily   Jardiance 25 MG Tabs tablet Generic drug: empagliflozin Take 25 mg by mouth daily.   metFORMIN 1000 MG tablet Commonly known as: GLUCOPHAGE Take 1 tablet (1,000 mg total) by mouth 2 (two) times daily with a meal.   multivitamin tablet Take 1 tablet by mouth daily.   vitamin C 500 MG tablet Commonly known as: ASCORBIC ACID Take 500 mg by mouth daily.           Objective:   Physical Exam BP 115/74 (BP Location: Left Arm, Patient Position: Sitting, Cuff Size:  Normal)   Pulse 70   Temp (!) 97.2 F (36.2 C) (Temporal)   Resp 16   Ht 6' (1.829 m)   Wt 201 lb (91.2 kg)   SpO2 99%   BMI 27.26 kg/m   General: Well developed, NAD, BMI noted Neck: No  thyromegaly  HEENT:  Normocephalic . Face symmetric, atraumatic Lungs:  CTA B Normal respiratory effort, no intercostal retractions, no accessory muscle use. Heart: RRR,  no murmur.  No pretibial edema bilaterally  Abdomen:  Not distended, soft, non-tender. No rebound or rigidity.   Skin: Exposed areas without rash. Not pale. Not jaundice DM foot exam: No edema, good pulses, pinprick examination normal Neurologic:  alert & oriented X3.  Speech normal, gait appropriate for age and unassisted Strength symmetric and appropriate for age.  Psych: Cognition and judgment appear intact.  Cooperative with normal attention span and concentration.  Behavior appropriate. No anxious or depressed appearing.     Assessment     Assessment DM 2009 Dyslipidemia elevated TG Kidney stones Rectus diastasis Elevated LFTs, first noted 2014, on metformin for years . Hepatitis serology (-) 10-2015  PLAN: Here for CPX [id number] A1c 5.8, doing well with diet, has gained weight but plans to increase his physical activity.  Continue Jardiance and Metformin. Foot exam negative Hyperlipidemia: Last LDL 110, not on Lipitor, he is very reluctant to take it, benefits beyond dropping the LDL discussed with the patient.  He will think about it. Social: Working from home, good Covid precautions. RTC 6 months   This visit occurred during the SARS-CoV-2 public health emergency.  Safety protocols were in place, including screening questions prior to the visit, additional usage of staff PPE, and extensive cleaning of exam room while observing appropriate contact time as indicated for disinfecting solutions.

## 2019-02-25 NOTE — Patient Instructions (Addendum)
Per our records you are due for an eye exam. Please contact your eye doctor to schedule an appointment. Please have them send copies of your office visit notes to [place]. Our fax number is [phone].    GO TO THE FRONT DESK Schedule your next appointment   for a checkup in 6 months

## 2019-02-25 NOTE — Progress Notes (Signed)
Pre visit review using our clinic review tool, if applicable. No additional management support is needed unless otherwise documented below in the visit note. 

## 2019-02-26 NOTE — Assessment & Plan Note (Signed)
Here for CPX [id number] A1c 5.8, doing well with diet, has gained weight but plans to increase his physical activity.  Continue Jardiance and Metformin. Foot exam negative Hyperlipidemia: Last LDL 110, not on Lipitor, he is very reluctant to take it, benefits beyond dropping the LDL discussed with the patient.  He will think about it. Social: Working from home, good Covid precautions. RTC 6 months

## 2019-02-26 NOTE — Assessment & Plan Note (Signed)
-   Td 2013 - prevnar 2016 - pnm 23: 05-2016 - shingrix : likes to hold off  -flu shot today  Prostate Ca screening: +FH, father age 52;   DRE- PSA wnl 2019.  CCS: FH colon ca- GM FH colon polyps- mother  Cscope 05-2014, flex sig 09-2014 ok, s/p cscope 11-2017 Recent labs reviewed Diet and exercise discussed

## 2019-03-01 ENCOUNTER — Encounter: Payer: BLUE CROSS/BLUE SHIELD | Admitting: Internal Medicine

## 2019-05-26 ENCOUNTER — Other Ambulatory Visit: Payer: Self-pay | Admitting: Internal Medicine

## 2019-07-14 ENCOUNTER — Encounter: Payer: Self-pay | Admitting: Internal Medicine

## 2019-07-14 DIAGNOSIS — N529 Male erectile dysfunction, unspecified: Secondary | ICD-10-CM

## 2019-07-14 DIAGNOSIS — N486 Induration penis plastica: Secondary | ICD-10-CM

## 2019-08-29 ENCOUNTER — Encounter: Payer: Self-pay | Admitting: Internal Medicine

## 2019-08-29 ENCOUNTER — Other Ambulatory Visit: Payer: Self-pay

## 2019-08-29 ENCOUNTER — Ambulatory Visit: Payer: No Typology Code available for payment source | Admitting: Internal Medicine

## 2019-08-29 VITALS — BP 122/67 | HR 58 | Temp 96.3°F | Resp 16 | Ht 72.0 in | Wt 197.5 lb

## 2019-08-29 DIAGNOSIS — E1169 Type 2 diabetes mellitus with other specified complication: Secondary | ICD-10-CM

## 2019-08-29 DIAGNOSIS — E785 Hyperlipidemia, unspecified: Secondary | ICD-10-CM

## 2019-08-29 LAB — HM DIABETES EYE EXAM

## 2019-08-29 LAB — BASIC METABOLIC PANEL
BUN: 13 mg/dL (ref 6–23)
CO2: 29 mEq/L (ref 19–32)
Calcium: 9.7 mg/dL (ref 8.4–10.5)
Chloride: 99 mEq/L (ref 96–112)
Creatinine, Ser: 0.84 mg/dL (ref 0.40–1.50)
GFR: 95.49 mL/min (ref >60.00)
Glucose, Bld: 118 mg/dL — ABNORMAL HIGH (ref 70–99)
Potassium: 4.3 mEq/L (ref 3.5–5.1)
Sodium: 135 mEq/L (ref 135–145)

## 2019-08-29 LAB — HEMOGLOBIN A1C: Hgb A1c MFr Bld: 5.8 % (ref 4.6–6.5)

## 2019-08-29 NOTE — Patient Instructions (Addendum)
Per our records you are due for an eye exam. Please contact your eye doctor to schedule an appointment. Please have them send copies of your office visit notes to us. Our fax number is [phone].    GO TO THE LAB : Get the blood work     GO TO THE FRONT DESK, PLEASE SCHEDULE YOUR APPOINTMENTS Come back for   a physical exam in 6 months 

## 2019-08-29 NOTE — Progress Notes (Signed)
Pre visit review using our clinic review tool, if applicable. No additional management support is needed unless otherwise documented below in the visit note. 

## 2019-08-29 NOTE — Progress Notes (Signed)
Subjective:    Patient ID: Gregory Williams., male    DOB: 09/24/1965, 54 y.o.   MRN: [id number]  DOS:  08/29/2019 Type of visit - description: Follow-up, here with his wife Reports he is doing great with diet and exercise.  Wt Readings from Last 3 Encounters:  08/29/19 197 lb 8 oz (89.6 kg)  02/25/19 201 lb (91.2 kg)  02/22/18 191 lb 8 oz (86.9 kg)     Review of Systems Denies nausea, vomiting.  No diarrhea. No genital rashes. Ambulatory CBGs 100 to 130  Past Medical History:  Diagnosis Date  . Diabetes mellitus 2/09  . Dyslipidemia 05/28/2016  . Erectile dysfunction   . Kidney stones 05/28/2016  . Peyronie's disease     Past Surgical History:  Procedure Laterality Date  . COLONOSCOPY    . Kidney Stone Extraction    . POLYPECTOMY      Allergies as of 08/29/2019   No Known Allergies     Medication List       Accurate as of August 29, 2019 11:59 PM. If you have any questions, ask your nurse or doctor.        Accu-Chek Aviva Plus w/Device Kit Check blood sugar no more than twice daily   accu-chek soft touch lancets Check blood sugar no more than twice daily   glucose blood test strip Commonly known as: Accu-Chek Aviva Plus Check blood sugar no more than twice daily   Jardiance 25 MG Tabs tablet Generic drug: empagliflozin Take 25 mg by mouth daily.   metFORMIN 1000 MG tablet Commonly known as: GLUCOPHAGE Take 1 tablet (1,000 mg total) by mouth 2 (two) times daily with a meal.   multivitamin tablet Take 1 tablet by mouth daily.   vitamin C 500 MG tablet Commonly known as: ASCORBIC ACID Take 500 mg by mouth daily.          Objective:   Physical Exam BP 122/67 (BP Location: Left Arm, Patient Position: Sitting, Cuff Size: Normal)   Pulse (!) 58   Temp (!) 96.3 F (35.7 C) (Temporal)   Resp 16   Ht 6' (1.829 m)   Wt 197 lb 8 oz (89.6 kg)   SpO2 100%   BMI 26.79 kg/m  General:   Well developed, NAD, BMI noted. HEENT:  Normocephalic . Face  symmetric, atraumatic Lungs:  CTA B Normal respiratory effort, no intercostal retractions, no accessory muscle use. Heart: RRR,  no murmur.  Lower extremities: no pretibial edema bilaterally  Skin: Not pale. Not jaundice Neurologic:  alert & oriented X3.  Speech normal, gait appropriate for age and unassisted Psych--  Cognition and judgment appear intact.  Cooperative with normal attention span and concentration.  Behavior appropriate. No anxious or depressed appearing.      Assessment     Assessment DM 2009 Dyslipidemia elevated TG Kidney stones Rectus diastasis Elevated LFTs, first noted 2014, on metformin for years . Hepatitis serology (-) 10-2015  PLAN: DM: Currently on Metformin, Jardiance, doing great with lifestyle, has lost 4 pounds.  Last A1c below 6.  No hypoglycemia. Plan: Continue same meds, continue healthy lifestyle, A1c, BMP.  Recommend to see the eye doctor. Dyslipidemia: Diet controlled, recheck on RTC. Preventive care: Had Covid shots RTC 6 months CPX  This visit occurred during the SARS-CoV-2 public health emergency.  Safety protocols were in place, including screening questions prior to the visit, additional usage of staff PPE, and extensive cleaning of exam room while observing appropriate contact  time as indicated for disinfecting solutions.

## 2019-08-30 NOTE — Assessment & Plan Note (Signed)
DM: Currently on Metformin, Jardiance, doing great with lifestyle, has lost 4 pounds.  Last A1c below 6.  No hypoglycemia. Plan: Continue same meds, continue healthy lifestyle, A1c, BMP.  Recommend to see the eye doctor. Dyslipidemia: Diet controlled, recheck on RTC. Preventive care: Had Covid shots RTC 6 months CPX

## 2019-10-07 ENCOUNTER — Encounter: Payer: Self-pay | Admitting: Internal Medicine

## 2019-11-24 ENCOUNTER — Other Ambulatory Visit: Payer: Self-pay | Admitting: Internal Medicine

## 2019-12-12 ENCOUNTER — Encounter: Payer: Self-pay | Admitting: Internal Medicine

## 2020-02-03 ENCOUNTER — Encounter: Payer: Self-pay | Admitting: Internal Medicine

## 2020-02-20 ENCOUNTER — Encounter: Payer: Self-pay | Admitting: Internal Medicine

## 2020-02-20 ENCOUNTER — Ambulatory Visit (INDEPENDENT_AMBULATORY_CARE_PROVIDER_SITE_OTHER): Payer: No Typology Code available for payment source | Admitting: Internal Medicine

## 2020-02-20 ENCOUNTER — Other Ambulatory Visit: Payer: Self-pay

## 2020-02-20 VITALS — BP 129/84 | HR 66 | Temp 97.6°F | Ht 72.0 in | Wt 198.2 lb

## 2020-02-20 DIAGNOSIS — Z0000 Encounter for general adult medical examination without abnormal findings: Secondary | ICD-10-CM | POA: Diagnosis not present

## 2020-02-20 DIAGNOSIS — Z23 Encounter for immunization: Secondary | ICD-10-CM

## 2020-02-20 DIAGNOSIS — E785 Hyperlipidemia, unspecified: Secondary | ICD-10-CM

## 2020-02-20 DIAGNOSIS — E1169 Type 2 diabetes mellitus with other specified complication: Secondary | ICD-10-CM

## 2020-02-20 LAB — COMPREHENSIVE METABOLIC PANEL
ALT: 13 U/L (ref 0–53)
AST: 15 U/L (ref 0–37)
Albumin: 4.7 g/dL (ref 3.5–5.2)
Alkaline Phosphatase: 43 U/L (ref 39–117)
BUN: 13 mg/dL (ref 6–23)
CO2: 32 mEq/L (ref 19–32)
Calcium: 9.9 mg/dL (ref 8.4–10.5)
Chloride: 96 mEq/L (ref 96–112)
Creatinine, Ser: 1.02 mg/dL (ref 0.40–1.50)
GFR: 83.78 mL/min (ref >60.00)
Glucose, Bld: 121 mg/dL — ABNORMAL HIGH (ref 70–99)
Potassium: 4.3 mEq/L (ref 3.5–5.1)
Sodium: 135 mEq/L (ref 135–145)
Total Bilirubin: 1 mg/dL (ref 0.2–1.2)
Total Protein: 7.4 g/dL (ref 6.0–8.3)

## 2020-02-20 LAB — CBC WITH DIFFERENTIAL/PLATELET
Basophils Absolute: 0 10*3/uL (ref 0.0–0.1)
Basophils Relative: 0.5 % (ref 0.0–3.0)
Eosinophils Absolute: 0.1 10*3/uL (ref 0.0–0.7)
Eosinophils Relative: 1.1 % (ref 0.0–5.0)
HCT: 49.3 % (ref 39.0–52.0)
Hemoglobin: 16.8 g/dL (ref 13.0–17.0)
Lymphocytes Relative: 33.8 % (ref 12.0–46.0)
Lymphs Abs: 2 10*3/uL (ref 0.7–4.0)
MCHC: 34 g/dL (ref 30.0–36.0)
MCV: 91.4 fl (ref 78.0–100.0)
Monocytes Absolute: 0.4 10*3/uL (ref 0.1–1.0)
Monocytes Relative: 6.6 % (ref 3.0–12.0)
Neutro Abs: 3.4 10*3/uL (ref 1.4–7.7)
Neutrophils Relative %: 58 % (ref 43.0–77.0)
Platelets: 192 10*3/uL (ref 150.0–400.0)
RBC: 5.4 Mil/uL (ref 4.22–5.81)
RDW: 12.5 % (ref 11.5–15.5)
WBC: 5.9 10*3/uL (ref 4.0–10.5)

## 2020-02-20 LAB — LIPID PANEL
Cholesterol: 205 mg/dL — ABNORMAL HIGH (ref 0–200)
HDL: 49.7 mg/dL (ref >39.00)
NonHDL: 155.45
Total CHOL/HDL Ratio: 4
Triglycerides: 236 mg/dL — ABNORMAL HIGH (ref 0.0–149.0)
VLDL: 47.2 mg/dL — ABNORMAL HIGH (ref 0.0–40.0)

## 2020-02-20 LAB — HEMOGLOBIN A1C: Hgb A1c MFr Bld: 5.8 % (ref 4.6–6.5)

## 2020-02-20 LAB — PSA: PSA: 0.67 ng/mL (ref 0.10–4.00)

## 2020-02-20 LAB — LDL CHOLESTEROL, DIRECT: Direct LDL: 125 mg/dL

## 2020-02-20 NOTE — Progress Notes (Signed)
Subjective:    Patient ID: Gregory Alabi., male    DOB: 1965-05-22, 54 y.o.   MRN: [id number]  DOS:  02/20/2020 Type of visit - description: cpx  Since the last office visit is doing well, here with his wife, has no concerns.  Wt Readings from Last 3 Encounters:  02/20/20 198 lb 3.2 oz (89.9 kg)  08/29/19 197 lb 8 oz (89.6 kg)  02/25/19 201 lb (91.2 kg)    Review of Systems   A 14 point review of systems is negative   Past Medical History:  Diagnosis Date  . Diabetes mellitus 2/09  . Dyslipidemia 05/28/2016  . Erectile dysfunction   . Kidney stones 05/28/2016  . Peyronie's disease     Past Surgical History:  Procedure Laterality Date  . COLONOSCOPY    . Kidney Stone Extraction    . POLYPECTOMY      Allergies as of 02/20/2020   No Known Allergies     Medication List       Accurate as of February 20, 2020  2:22 PM. If you have any questions, ask your nurse or doctor.        STOP taking these medications   vitamin C 500 MG tablet Commonly known as: ASCORBIC ACID Stopped by: Kathlene November, MD     TAKE these medications   Accu-Chek Aviva Plus w/Device Kit Check blood sugar no more than twice daily   accu-chek soft touch lancets Check blood sugar no more than twice daily   empagliflozin 25 MG Tabs tablet Commonly known as: Jardiance Take 1 tablet (25 mg total) by mouth daily.   glucose blood test strip Commonly known as: Accu-Chek Aviva Plus Check blood sugar no more than twice daily   metFORMIN 1000 MG tablet Commonly known as: GLUCOPHAGE Take 1 tablet (1,000 mg total) by mouth 2 (two) times daily with a meal.   multivitamin tablet Take 1 tablet by mouth daily.          Objective:   Physical Exam BP 129/84 (BP Location: Left Arm, Patient Position: Sitting, Cuff Size: Large)   Pulse 66   Temp 97.6 F (36.4 C) (Oral)   Ht 6' (1.829 m)   Wt 198 lb 3.2 oz (89.9 kg)   SpO2 99%   BMI 26.88 kg/m  General: Well developed, NAD, BMI noted Neck:  No  thyromegaly  HEENT:  Normocephalic . Face symmetric, atraumatic Lungs:  CTA B Normal respiratory effort, no intercostal retractions, no accessory muscle use. Heart: RRR,  no murmur.  Abdomen:  Not distended, soft, non-tender. No rebound or rigidity.   Lower extremities: no pretibial edema bilaterally DRE: Normal sphincter tone, no stools, prostate normal Skin: Exposed areas without rash. Not pale. Not jaundice Neurologic:  alert & oriented X3.  Speech normal, gait appropriate for age and unassisted Strength symmetric and appropriate for age.  Psych: Cognition and judgment appear intact.  Cooperative with normal attention span and concentration.  Behavior appropriate. No anxious or depressed appearing.     Assessment    Assessment DM 2009 Dyslipidemia elevated TG Kidney stones Rectus diastasis Elevated LFTs, first noted 2014, on metformin for years . Hepatitis serology (-) 10-2015  PLAN: Here for CPX DM: Doing well with lifestyle, weight stable at home, continue Metformin, Jardiance. Had an eye exam 08/29/2019, check A1c and micro. Dyslipidemia:  well controlled on diet, checking FLP, benefit of statins discussed, still remains reluctant. RTC 6 months  This visit occurred during the SARS-CoV-2 public  health emergency.  Safety protocols were in place, including screening questions prior to the visit, additional usage of staff PPE, and extensive cleaning of exam room while observing appropriate contact time as indicated for disinfecting solutions.

## 2020-02-20 NOTE — Assessment & Plan Note (Signed)
-   Td 2013 - prevnar 2016;  pnm 23: 05-2016 - shingrix discussed before - covid vax x 2 , plans to get a booster -flu shottoday  Prostate Ca screening: +FH, father age 53; DRE normal, check PSA CCS: FH colon ca- GM FH colon polyps- mother  Cscope 05-2014, flex sig 09-2014 ok,s/pcscope9-2019, next per GI -Diet and exercise discussed although he is doing well - labs: CMP, FLP, CBC, A1c, micro, PSA

## 2020-02-20 NOTE — Patient Instructions (Signed)
   GO TO THE LAB : Get the blood work     GO TO THE FRONT DESK, PLEASE SCHEDULE YOUR APPOINTMENTS Come back for a checkup in 6 months 

## 2020-02-20 NOTE — Assessment & Plan Note (Signed)
Here for CPX DM: Doing well with lifestyle, weight stable at home, continue Metformin, Jardiance. Had an eye exam 08/29/2019, check A1c and micro. Dyslipidemia:  well controlled on diet, checking FLP, benefit of statins discussed, still remains reluctant. RTC 6 months

## 2020-02-21 LAB — MICROALBUMIN / CREATININE URINE RATIO
Creatinine,U: 113.2 mg/dL
Microalb Creat Ratio: 0.7 mg/g (ref 0.0–30.0)
Microalb, Ur: 0.8 mg/dL (ref 0.0–1.9)

## 2020-03-30 ENCOUNTER — Encounter: Payer: Self-pay | Admitting: Internal Medicine

## 2020-03-30 MED ORDER — ACCU-CHEK SOFT TOUCH LANCETS MISC
12 refills | Status: DC
Start: 2020-03-30 — End: 2021-07-10

## 2020-06-02 ENCOUNTER — Other Ambulatory Visit: Payer: Self-pay | Admitting: Internal Medicine

## 2020-07-12 ENCOUNTER — Other Ambulatory Visit: Payer: Self-pay | Admitting: Internal Medicine

## 2020-07-24 ENCOUNTER — Other Ambulatory Visit: Payer: Self-pay

## 2020-07-24 MED ORDER — ACCU-CHEK AVIVA PLUS VI STRP: ORAL_STRIP | 12 refills | Status: DC

## 2020-09-03 ENCOUNTER — Encounter: Payer: Self-pay | Admitting: Internal Medicine

## 2020-09-03 ENCOUNTER — Ambulatory Visit: Payer: No Typology Code available for payment source | Admitting: Internal Medicine

## 2020-09-03 ENCOUNTER — Other Ambulatory Visit: Payer: Self-pay

## 2020-09-03 VITALS — BP 122/72 | HR 67 | Temp 98.0°F | Resp 16 | Ht 72.0 in | Wt 198.0 lb

## 2020-09-03 DIAGNOSIS — E1169 Type 2 diabetes mellitus with other specified complication: Secondary | ICD-10-CM

## 2020-09-03 DIAGNOSIS — E785 Hyperlipidemia, unspecified: Secondary | ICD-10-CM | POA: Diagnosis not present

## 2020-09-03 LAB — BASIC METABOLIC PANEL
BUN: 14 mg/dL (ref 6–23)
CO2: 27 mEq/L (ref 19–32)
Calcium: 9.8 mg/dL (ref 8.4–10.5)
Chloride: 101 mEq/L (ref 96–112)
Creatinine, Ser: 0.88 mg/dL (ref 0.40–1.50)
GFR: 97.65 mL/min (ref >60.00)
Glucose, Bld: 111 mg/dL — ABNORMAL HIGH (ref 70–99)
Potassium: 4.1 mEq/L (ref 3.5–5.1)
Sodium: 137 mEq/L (ref 135–145)

## 2020-09-03 NOTE — Patient Instructions (Addendum)
Per our records you are due for your diabetic eye exam. Please contact your eye doctor to schedule an appointment. Please have them send copies of your office visit notes to [place]. Our fax number is [phone]. If you need a referral to an eye doctor please let us know.     GO TO THE LAB : Get the blood work     [place], [place] back for a physical by 01-2021

## 2020-09-03 NOTE — Progress Notes (Signed)
   Subjective:    Patient ID: Gregory Alcock., male    DOB: 12-17-1965, 55 y.o.   MRN: [id number]  DOS:  09/03/2020 Type of visit - description: ROV  Here with his wife. Has no major concerns. His sister age 70 was recently diagnosed with CAD apparently severe. He continues to do well with diet and exercise.   Review of Systems See above   Past Medical History:  Diagnosis Date   Diabetes mellitus 2/09   Dyslipidemia 05/28/2016   Erectile dysfunction    Kidney stones 05/28/2016   Peyronie's disease     Past Surgical History:  Procedure Laterality Date   COLONOSCOPY     Kidney Stone Extraction     POLYPECTOMY      Allergies as of 09/03/2020   No Known Allergies      Medication List        Accurate as of September 03, 2020 11:59 PM. If you have any questions, ask your nurse or doctor.          Accu-Chek Aviva Plus test strip Generic drug: glucose blood Check blood sugar no more than twice daily   Accu-Chek Aviva Plus w/Device Kit Check blood sugar no more than twice daily   accu-chek soft touch lancets Check blood sugar no more than twice daily   empagliflozin 25 MG Tabs tablet Commonly known as: Jardiance Take 1 tablet (25 mg total) by mouth daily.   metFORMIN 1000 MG tablet Commonly known as: GLUCOPHAGE Take 1 tablet (1,000 mg total) by mouth 2 (two) times daily with a meal.   multivitamin tablet Take 1 tablet by mouth daily.           Objective:   Physical Exam BP 122/72 (BP Location: Left Arm, Patient Position: Sitting, Cuff Size: Normal)   Pulse 67   Temp 98 F (36.7 C) (Oral)   Resp 16   Ht 6' (1.829 m)   Wt 198 lb (89.8 kg)   SpO2 97%   BMI 26.85 kg/m  General:   Well developed, NAD, BMI noted. HEENT:  Normocephalic . Face symmetric, atraumatic Lungs:  CTA B Normal respiratory effort, no intercostal retractions, no accessory muscle use. Heart: RRR,  no murmur.  DM foot exam: No edema, good pulses, pinprick normal Skin: Not pale.  Not jaundice Neurologic:  alert & oriented X3.  Speech normal, gait appropriate for age and unassisted Psych--  Cognition and judgment appear intact.  Cooperative with normal attention span and concentration.  Behavior appropriate. No anxious or depressed appearing.      Assessment     Assessment DM 2009 Dyslipidemia elevated TG Kidney stones Rectus diastasis Elevated LFTs, first noted 2014, on metformin for years . Hepatitis serology (-) 10-2015  PLAN: DM: Doing great with diet, exercise. Ambulatory CBGs typically 110, very rarely in the 90s.  Continue Jardiance and metformin, check A1c and BMP. Dyslipidemia: Patient aware of statin benefits, declines to take medications for now FH CAD: Sister age 87. Preventive care: Plans to get his COVID shot #4 in the fall RTC CPX 01-2021     This visit occurred during the SARS-CoV-2 public health emergency.  Safety protocols were in place, including screening questions prior to the visit, additional usage of staff PPE, and extensive cleaning of exam room while observing appropriate contact time as indicated for disinfecting solutions.

## 2020-09-04 ENCOUNTER — Encounter: Payer: Self-pay | Admitting: Internal Medicine

## 2020-09-04 NOTE — Assessment & Plan Note (Signed)
DM: Doing great with diet, exercise. Ambulatory CBGs typically 110, very rarely in the 90s.  Continue Jardiance and metformin, check A1c and BMP. Dyslipidemia: Patient aware of statin benefits, declines to take medications for now FH CAD: Sister age 54. Preventive care: Plans to get his COVID shot #4 in the fall RTC CPX 01-2021

## 2020-09-10 ENCOUNTER — Telehealth: Payer: Self-pay | Admitting: *Deleted

## 2020-09-10 NOTE — Telephone Encounter (Signed)
Noted thank you

## 2020-09-10 NOTE — Telephone Encounter (Signed)
Pt's wife called and was advised that hgb a1c was not drawn last week and is still needed. She states she will make pt aware and will call back to schedule a lab appointment.

## 2020-09-10 NOTE — Telephone Encounter (Signed)
Pt had labs drawn 09/03/20. Orders were released for hgb a1c and bmp. When the harvest label printed it only contained the bmp and that is all that was drawn at his visit.  I have left message for pt to return my call as he will need a lab appt to complete the a1c.

## 2020-09-20 NOTE — Telephone Encounter (Signed)
Pt has lab appt scheduled for 10/02/20.

## 2020-09-20 NOTE — Addendum Note (Signed)
Addended by: Kelle Darting A on: 09/20/2020 04:18 PM   Modules accepted: Orders

## 2020-10-02 ENCOUNTER — Other Ambulatory Visit: Payer: Self-pay

## 2020-10-02 ENCOUNTER — Other Ambulatory Visit (INDEPENDENT_AMBULATORY_CARE_PROVIDER_SITE_OTHER): Payer: No Typology Code available for payment source

## 2020-10-02 DIAGNOSIS — E1169 Type 2 diabetes mellitus with other specified complication: Secondary | ICD-10-CM | POA: Diagnosis not present

## 2020-10-02 LAB — HEMOGLOBIN A1C: Hgb A1c MFr Bld: 5.9 % (ref 4.6–6.5)

## 2020-10-02 NOTE — Progress Notes (Signed)
Pt here for redraw. No charge.

## 2020-10-10 ENCOUNTER — Other Ambulatory Visit: Payer: Self-pay | Admitting: Internal Medicine

## 2020-11-28 ENCOUNTER — Encounter: Payer: Self-pay | Admitting: Internal Medicine

## 2020-12-02 ENCOUNTER — Other Ambulatory Visit: Payer: Self-pay | Admitting: Internal Medicine

## 2021-02-19 ENCOUNTER — Encounter: Payer: Self-pay | Admitting: Internal Medicine

## 2021-02-20 ENCOUNTER — Ambulatory Visit (INDEPENDENT_AMBULATORY_CARE_PROVIDER_SITE_OTHER): Payer: No Typology Code available for payment source | Admitting: Internal Medicine

## 2021-02-20 ENCOUNTER — Encounter: Payer: Self-pay | Admitting: Internal Medicine

## 2021-02-20 VITALS — BP 136/84 | HR 63 | Temp 98.0°F | Resp 16 | Ht 72.0 in | Wt 202.0 lb

## 2021-02-20 DIAGNOSIS — E1169 Type 2 diabetes mellitus with other specified complication: Secondary | ICD-10-CM | POA: Diagnosis not present

## 2021-02-20 DIAGNOSIS — Z23 Encounter for immunization: Secondary | ICD-10-CM

## 2021-02-20 DIAGNOSIS — Z0000 Encounter for general adult medical examination without abnormal findings: Secondary | ICD-10-CM | POA: Diagnosis not present

## 2021-02-20 DIAGNOSIS — E785 Hyperlipidemia, unspecified: Secondary | ICD-10-CM

## 2021-02-20 LAB — COMPREHENSIVE METABOLIC PANEL
ALT: 16 U/L (ref 0–53)
AST: 16 U/L (ref 0–37)
Albumin: 4.7 g/dL (ref 3.5–5.2)
Alkaline Phosphatase: 36 U/L — ABNORMAL LOW (ref 39–117)
BUN: 12 mg/dL (ref 6–23)
CO2: 26 mEq/L (ref 19–32)
Calcium: 9.9 mg/dL (ref 8.4–10.5)
Chloride: 100 mEq/L (ref 96–112)
Creatinine, Ser: 0.88 mg/dL (ref 0.40–1.50)
GFR: 97.33 mL/min (ref >60.00)
Glucose, Bld: 105 mg/dL — ABNORMAL HIGH (ref 70–99)
Potassium: 4.1 mEq/L (ref 3.5–5.1)
Sodium: 135 mEq/L (ref 135–145)
Total Bilirubin: 1 mg/dL (ref 0.2–1.2)
Total Protein: 7.3 g/dL (ref 6.0–8.3)

## 2021-02-20 LAB — PSA: PSA: 0.47 ng/mL (ref 0.10–4.00)

## 2021-02-20 LAB — LDL CHOLESTEROL, DIRECT: Direct LDL: 139 mg/dL

## 2021-02-20 LAB — CBC WITH DIFFERENTIAL/PLATELET
Basophils Absolute: 0 10*3/uL (ref 0.0–0.1)
Basophils Relative: 0.7 % (ref 0.0–3.0)
Eosinophils Absolute: 0.1 10*3/uL (ref 0.0–0.7)
Eosinophils Relative: 1.5 % (ref 0.0–5.0)
HCT: 46.6 % (ref 39.0–52.0)
Hemoglobin: 15.6 g/dL (ref 13.0–17.0)
Lymphocytes Relative: 39.9 % (ref 12.0–46.0)
Lymphs Abs: 2.1 10*3/uL (ref 0.7–4.0)
MCHC: 33.4 g/dL (ref 30.0–36.0)
MCV: 92.5 fl (ref 78.0–100.0)
Monocytes Absolute: 0.3 10*3/uL (ref 0.1–1.0)
Monocytes Relative: 6.4 % (ref 3.0–12.0)
Neutro Abs: 2.7 10*3/uL (ref 1.4–7.7)
Neutrophils Relative %: 51.5 % (ref 43.0–77.0)
Platelets: 185 10*3/uL (ref 150.0–400.0)
RBC: 5.04 Mil/uL (ref 4.22–5.81)
RDW: 12.9 % (ref 11.5–15.5)
WBC: 5.2 10*3/uL (ref 4.0–10.5)

## 2021-02-20 LAB — HEMOGLOBIN A1C: Hgb A1c MFr Bld: 6 % (ref 4.6–6.5)

## 2021-02-20 LAB — MICROALBUMIN / CREATININE URINE RATIO
Creatinine,U: 81.5 mg/dL
Microalb Creat Ratio: 0.9 mg/g (ref 0.0–30.0)
Microalb, Ur: <0.7 mg/dL (ref 0.0–1.9)

## 2021-02-20 LAB — LIPID PANEL
Cholesterol: 216 mg/dL — ABNORMAL HIGH (ref 0–200)
HDL: 56.8 mg/dL (ref >39.00)
NonHDL: 159.21
Total CHOL/HDL Ratio: 4
Triglycerides: 206 mg/dL — ABNORMAL HIGH (ref 0.0–149.0)
VLDL: 41.2 mg/dL — ABNORMAL HIGH (ref 0.0–40.0)

## 2021-02-20 NOTE — Patient Instructions (Signed)
  GO TO THE LAB : Get the blood work     GO TO THE FRONT DESK, PLEASE SCHEDULE YOUR APPOINTMENTS Come back for a checkup in 6 months 

## 2021-02-20 NOTE — Progress Notes (Signed)
Subjective:    Patient ID: Gregory Williams., male    DOB: 1966/02/14, 55 y.o.   MRN: [id number]  DOS:  02/20/2021 Type of visit - description: CPX  Here with his wife, CPX. He has essentially no concerns.    BP Readings from Last 3 Encounters:  02/20/21 136/84  09/03/20 122/72  02/20/20 129/84    Review of Systems   A 14 point review of systems is negative    Past Medical History:  Diagnosis Date   Diabetes mellitus 2/09   Dyslipidemia 05/28/2016   Erectile dysfunction    Kidney stones 05/28/2016   Peyronie's disease     Past Surgical History:  Procedure Laterality Date   COLONOSCOPY     Kidney Stone Extraction     POLYPECTOMY     Social History   Socioeconomic History   Marital status: Married    Spouse name: Pamala Hurry   Number of children: 3   Years of education: Not on file   Highest education level: Not on file  Occupational History   Occupation: Chartered loss adjuster  Tobacco Use   Smoking status: Never   Smokeless tobacco: Never  Substance and Sexual Activity   Alcohol use: Not Currently    Alcohol/week: 0.0 standard drinks   Drug use: No   Sexual activity: Yes    Partners: Female  Other Topics Concern   Not on file  Social History Narrative   Ok to call results to his wife Pamala Hurry   [phone]   Household: pt, wife, one daughter    Social Determinants of Health   Financial Resource Strain: Not on file  Food Insecurity: Not on file  Transportation Needs: Not on file  Physical Activity: Not on file  Stress: Not on file  Social Connections: Not on file  Intimate Partner Violence: Not on file    Allergies as of 02/20/2021   No Known Allergies      Medication List        Accurate as of February 20, 2021  5:53 PM. If you have any questions, ask your nurse or doctor.          Accu-Chek Aviva Plus test strip Generic drug: glucose blood Check blood sugar no more than twice daily   Accu-Chek Aviva Plus w/Device Kit Check blood sugar  no more than twice daily   accu-chek soft touch lancets Check blood sugar no more than twice daily   Jardiance 25 MG Tabs tablet Generic drug: empagliflozin TAKE 1 TABLET (25 MG TOTAL) BY MOUTH DAILY.   metFORMIN 1000 MG tablet Commonly known as: GLUCOPHAGE Take 1 tablet (1,000 mg total) by mouth 2 (two) times daily with a meal.   multivitamin tablet Take 1 tablet by mouth daily.           Objective:   Physical Exam BP 136/84 (BP Location: Left Arm, Patient Position: Sitting, Cuff Size: Small)   Pulse 63   Temp 98 F (36.7 C) (Oral)   Resp 16   Ht 6' (1.829 m)   Wt 202 lb (91.6 kg)   SpO2 96%   BMI 27.40 kg/m  General: Well developed, NAD, BMI noted Neck: No  thyromegaly  HEENT:  Normocephalic . Face symmetric, atraumatic Lungs:  CTA B Normal respiratory effort, no intercostal retractions, no accessory muscle use. Heart: RRR,  no murmur.  Abdomen:  Not distended, soft, non-tender. No rebound or rigidity.   Lower extremities: no pretibial edema bilaterally  Skin: Exposed areas without rash.  Not pale. Not jaundice Neurologic:  alert & oriented X3.  Speech normal, gait appropriate for age and unassisted Strength symmetric and appropriate for age.  Psych: Cognition and judgment appear intact.  Cooperative with normal attention span and concentration.  Behavior appropriate. No anxious or depressed appearing.     Assessment    Assessment DM 2009 Dyslipidemia elevated TG Kidney stones Rectus diastasis Elevated LFTs, first noted 2014, on metformin for years . Hepatitis serology (-) 10-2015  PLAN: Here for CPX DM: On metformin, Jardiance, ambulatory CBGs typically in the low 110s.  Checking labs Dyslipidemia: On no meds by choice, again I explained  clearly the benefit of statins (prevention of cardiovascular events). RTC 6 months    This visit occurred during the SARS-CoV-2 public health emergency.  Safety protocols were in place, including screening  questions prior to the visit, additional usage of staff PPE, and extensive cleaning of exam room while observing appropriate contact time as indicated for disinfecting solutions.

## 2021-02-20 NOTE — Assessment & Plan Note (Signed)
-   Td 2013 - prevnar 2016;  pnm 23: 05-2016 - shingrix discussed - declines for now  - covid vax : had a bivalent booster thus UTD -flu shot today  Prostate Ca screening: +FH, father age 54; DRE normal last year , check PSA CCS: FH colon ca- GM FH colon polyps- mother  Cscope 05-2014, flex sig 09-2014 ok, s/p cscope 11-2017: WNL, next 2024 -Diet and exercise: Is doing well  -Labs: CMP, FLP, CBC, PSA, A1c, micro - Advance care planning package provided

## 2021-02-20 NOTE — Assessment & Plan Note (Signed)
Here for CPX DM: On metformin, Jardiance, ambulatory CBGs typically in the low 110s.  Checking labs Dyslipidemia: On no meds by choice, again I explained  clearly the benefit of statins (prevention of cardiovascular events). RTC 6 months

## 2021-04-07 ENCOUNTER — Other Ambulatory Visit: Payer: Self-pay | Admitting: Internal Medicine

## 2021-05-07 ENCOUNTER — Encounter: Payer: Self-pay | Admitting: Internal Medicine

## 2021-06-05 ENCOUNTER — Other Ambulatory Visit: Payer: Self-pay | Admitting: Internal Medicine

## 2021-07-10 ENCOUNTER — Other Ambulatory Visit: Payer: Self-pay | Admitting: Internal Medicine

## 2021-09-02 ENCOUNTER — Ambulatory Visit: Payer: No Typology Code available for payment source | Admitting: Internal Medicine

## 2021-10-04 ENCOUNTER — Other Ambulatory Visit: Payer: Self-pay | Admitting: Internal Medicine

## 2021-10-24 ENCOUNTER — Encounter: Payer: Self-pay | Admitting: Internal Medicine

## 2021-10-28 ENCOUNTER — Encounter: Payer: Self-pay | Admitting: Internal Medicine

## 2021-10-28 ENCOUNTER — Ambulatory Visit (INDEPENDENT_AMBULATORY_CARE_PROVIDER_SITE_OTHER): Payer: 59 | Admitting: Internal Medicine

## 2021-10-28 VITALS — BP 122/80 | HR 63 | Temp 98.0°F | Resp 16 | Ht 72.0 in | Wt 207.5 lb

## 2021-10-28 DIAGNOSIS — E785 Hyperlipidemia, unspecified: Secondary | ICD-10-CM

## 2021-10-28 DIAGNOSIS — E1169 Type 2 diabetes mellitus with other specified complication: Secondary | ICD-10-CM | POA: Diagnosis not present

## 2021-10-28 DIAGNOSIS — Z23 Encounter for immunization: Secondary | ICD-10-CM | POA: Diagnosis not present

## 2021-10-28 LAB — BASIC METABOLIC PANEL
BUN: 12 mg/dL (ref 6–23)
CO2: 27 mEq/L (ref 19–32)
Calcium: 9.7 mg/dL (ref 8.4–10.5)
Chloride: 100 mEq/L (ref 96–112)
Creatinine, Ser: 0.92 mg/dL (ref 0.40–1.50)
GFR: 93.71 mL/min (ref >60.00)
Glucose, Bld: 127 mg/dL — ABNORMAL HIGH (ref 70–99)
Potassium: 4.4 mEq/L (ref 3.5–5.1)
Sodium: 135 mEq/L (ref 135–145)

## 2021-10-28 LAB — MICROALBUMIN / CREATININE URINE RATIO
Creatinine,U: 24.7 mg/dL
Microalb Creat Ratio: 2.8 mg/g (ref 0.0–30.0)
Microalb, Ur: <0.7 mg/dL (ref 0.0–1.9)

## 2021-10-28 LAB — HEMOGLOBIN A1C: Hgb A1c MFr Bld: 6.2 % (ref 4.6–6.5)

## 2021-10-28 MED ORDER — EMPAGLIFLOZIN 25 MG PO TABS: 25.0000 mg | ORAL_TABLET | Freq: Every day | ORAL | 1 refills | Status: DC

## 2021-10-28 NOTE — Progress Notes (Unsigned)
   Subjective:    Patient ID: Gregory Williams., male    DOB: December 15, 1965, 56 y.o.   MRN: [id number]  DOS:  10/28/2021 Type of visit - description: Follow-up  Here for routine visit. Ran out of Jardiance about 3 weeks ago, CBGs are around 125, previously in the 110s.   Increased stress lately, lost his job. Wt Readings from Last 3 Encounters:  10/28/21 207 lb 8 oz (94.1 kg)  02/20/21 202 lb (91.6 kg)  09/03/20 198 lb (89.8 kg)     Review of Systems See above   Past Medical History:  Diagnosis Date   Diabetes mellitus 2/09   Dyslipidemia 05/28/2016   Erectile dysfunction    Kidney stones 05/28/2016   Peyronie's disease     Past Surgical History:  Procedure Laterality Date   COLONOSCOPY     Kidney Stone Extraction     POLYPECTOMY      Current Outpatient Medications  Medication Instructions   Accu-Chek Softclix Lancets lancets CHECK BLOOD SUGAR NO MORE THAN TWICE DAILY   Blood Glucose Monitoring Suppl (ACCU-CHEK AVIVA PLUS) w/Device KIT Check blood sugar no more than twice daily   glucose blood (ACCU-CHEK AVIVA PLUS) test strip Check blood sugar no more than twice daily   JARDIANCE 25 MG TABS tablet TAKE 1 TABLET (25 MG TOTAL) BY MOUTH DAILY.   metFORMIN (GLUCOPHAGE) 1000 MG tablet TAKE 1 TABLET (1,000 MG TOTAL) BY MOUTH TWICE A DAY WITH FOOD   Multiple Vitamin (MULTIVITAMIN) tablet 1 tablet, Oral, Daily       Objective:   Physical Exam BP 122/80   Pulse 63   Temp 98 F (36.7 C) (Oral)   Resp 16   Ht 6' (1.829 m)   Wt 207 lb 8 oz (94.1 kg)   SpO2 98%   BMI 28.14 kg/m  General:   Well developed, NAD, BMI noted. HEENT:  Normocephalic . Face symmetric, atraumatic Lungs:  CTA B Normal respiratory effort, no intercostal retractions, no accessory muscle use. Heart: RRR,  no murmur.  DM foot exam: No edema, good pedal pulses, pinprick examination Skin: Not pale. Not jaundice Neurologic:  alert & oriented X3.  Speech normal, gait appropriate for age and  unassisted Psych--  Cognition and judgment appear intact.  Cooperative with normal attention span and concentration.  Behavior appropriate. No anxious or depressed appearing.      Assessment     Assessment DM 2009 Dyslipidemia elevated TG Kidney stones Rectus diastasis Elevated LFTs, first noted 2014, on metformin for years . Hepatitis serology (-) 10-2015  PLAN:  DM: A1c 6 months ago was   6.0.  He is on metformin and Jardiance however due to insurance issue has not gotten any Jardiance in the last 3 weeks. He is still doing well with diet, and exercise although he admits there is room for improvement. Plan: Check A1c, BMP, micro. Increase physical activity (he lost his job, has more time available) RF Jardiance.  Dyslipidemia: Last LDL increased from 125-139.  See previous entries, has declined statins. Preventive care: Tdap today Social: lost his job, + stress, counseled RTC CPX 01/2022    ===== Here for CPX DM: On metformin, Jardiance, ambulatory CBGs typically in the low 110s.  Checking labs Dyslipidemia: On no meds by choice, again I explained  clearly the benefit of statins (prevention of cardiovascular events). RTC 6 months

## 2021-10-28 NOTE — Patient Instructions (Addendum)
Per our records you are due for your diabetic eye exam. Please contact your eye doctor to schedule an appointment. Please have them send copies of your office visit notes to [place]. Our fax number is [phone]. If you need a referral to an eye doctor please let us know.     GO TO THE LAB : Get the blood work     [place], [place] Come back for   a physical exam by November 2023

## 2021-10-29 NOTE — Assessment & Plan Note (Signed)
DM: A1c few months ago was   6.0.  He is on metformin and Jardiance however due to insurance issue has not gotten any Jardiance in the last 3 weeks. He is still doing well with diet, and exercise although he admits there is room for improvement. Plan: Check A1c, BMP, micro. Increase physical activity (he lost his job, has more time available) RF Jardiance. Dyslipidemia: Last LDL increased from 125-139.  See previous entries, has declined statins. Preventive care: Tdap today Social: lost his job, + stress, counseled RTC CPX 01/2022

## 2022-03-11 ENCOUNTER — Ambulatory Visit (INDEPENDENT_AMBULATORY_CARE_PROVIDER_SITE_OTHER): Payer: 59 | Admitting: Internal Medicine

## 2022-03-11 ENCOUNTER — Encounter: Payer: Self-pay | Admitting: Internal Medicine

## 2022-03-11 VITALS — BP 128/82 | HR 64 | Temp 98.1°F | Resp 16 | Ht 72.0 in | Wt 212.1 lb

## 2022-03-11 DIAGNOSIS — E785 Hyperlipidemia, unspecified: Secondary | ICD-10-CM | POA: Diagnosis not present

## 2022-03-11 DIAGNOSIS — E1169 Type 2 diabetes mellitus with other specified complication: Secondary | ICD-10-CM | POA: Diagnosis not present

## 2022-03-11 DIAGNOSIS — Z0000 Encounter for general adult medical examination without abnormal findings: Secondary | ICD-10-CM

## 2022-03-11 NOTE — Assessment & Plan Note (Addendum)
Here for CPX DM: Currently on metformin and Jardiance, weight is up, diet discussed  ambulatory CBGs remain in the low 100s.  Will check A1c, further advised with results. Dyslipidemia: Will check an FLP today, again benefits of statins discussed, he has consistently declined.  Discussed coronary calcium score as an option to help him decide about the statins; he will let me know when ready FH CAD:  rec to start aspirin 81 mg.   RTC 4 months

## 2022-03-11 NOTE — Progress Notes (Signed)
Subjective:    Patient ID: Gregory Williams., male    DOB: June 24, 1965, 56 y.o.   MRN: [id number]  DOS:  03/11/2022 Type of visit - description: CPX, here with his wife  Has no major concerns. Diet  has not been the best lately, ambulatory CBGs remain in the low 110s.  Wt Readings from Last 3 Encounters:  03/11/22 212 lb 2 oz (96.2 kg)  10/28/21 207 lb 8 oz (94.1 kg)  02/20/21 202 lb (91.6 kg)   Review of Systems     A 14 point review of systems is negative    Past Medical History:  Diagnosis Date   Diabetes mellitus 2/09   Dyslipidemia 05/28/2016   Erectile dysfunction    Kidney stones 05/28/2016   Peyronie's disease     Past Surgical History:  Procedure Laterality Date   COLONOSCOPY     Kidney Stone Extraction     POLYPECTOMY      Social History   Socioeconomic History   Marital status: Married    Spouse name: Pamala Hurry   Number of children: 3   Years of education: Not on file   Highest education level: Not on file  Occupational History   Occupation: lost  job @ [employer]  Tobacco Use   Smoking status: Never   Smokeless tobacco: Never  Substance and Sexual Activity   Alcohol use: Not Currently    Alcohol/week: 0.0 standard drinks of alcohol   Drug use: No   Sexual activity: Yes    Partners: Female  Other Topics Concern   Not on file  Social History Narrative   Ok to call results to his wife Pamala Hurry   [phone]   Household: pt, wife, one daughter    Social Determinants of Radio broadcast assistant Strain: Not on file  Food Insecurity: Not on file  Transportation Needs: Not on file  Physical Activity: Not on file  Stress: Not on file  Social Connections: Not on file  Intimate Partner Violence: Not on file     Current Outpatient Medications  Medication Instructions   Accu-Chek Softclix Lancets lancets CHECK BLOOD SUGAR NO MORE THAN TWICE DAILY   aspirin EC 81 mg, Oral, Daily, Swallow whole.   Blood Glucose Monitoring Suppl  (ACCU-CHEK AVIVA PLUS) w/Device KIT Check blood sugar no more than twice daily   empagliflozin (JARDIANCE) 25 mg, Oral, Daily   glucose blood (ACCU-CHEK AVIVA PLUS) test strip Check blood sugar no more than twice daily   metFORMIN (GLUCOPHAGE) 1000 MG tablet TAKE 1 TABLET (1,000 MG TOTAL) BY MOUTH TWICE A DAY WITH FOOD   Multiple Vitamin (MULTIVITAMIN) tablet 1 tablet, Oral, Daily       Objective:   Physical Exam BP 128/82   Pulse 64   Temp 98.1 F (36.7 C) (Oral)   Resp 16   Ht 6' (1.829 m)   Wt 212 lb 2 oz (96.2 kg)   SpO2 96%   BMI 28.77 kg/m  General: Well developed, NAD, BMI noted Neck: No  thyromegaly  HEENT:  Normocephalic . Face symmetric, atraumatic Lungs:  CTA B Normal respiratory effort, no intercostal retractions, no accessory muscle use. Heart: RRR,  no murmur.  Abdomen:  Not distended, soft, non-tender. No rebound or rigidity.   Lower extremities: no pretibial edema bilaterally  Skin: Exposed areas without rash. Not pale. Not jaundice DRE: Normal sphincter tone, no stools, prostate normal Neurologic:  alert & oriented X3.  Speech normal, gait appropriate for age and  unassisted Strength symmetric and appropriate for age.  Psych: Cognition and judgment appear intact.  Cooperative with normal attention span and concentration.  Behavior appropriate. No anxious or depressed appearing.     Assessment   Assessment DM 2009 Dyslipidemia elevated TG Kidney stones Rectus diastasis Elevated LFTs, first noted 2014, on metformin for years . Hepatitis serology (-) 10-2015  PLAN: Here for CPX DM: Currently on metformin and Jardiance, weight is up, diet discussed  ambulatory CBGs remain in the low 100s.  Will check A1c, further advised with results. Dyslipidemia: Will check an FLP today, again benefits of statins discussed, he has consistently declined.  Discussed coronary calcium score as an option to help him decide about the statins; he will let me know when  ready FH CAD:  rec to start aspirin 81 mg.   RTC 4 months

## 2022-03-11 NOTE — Patient Instructions (Addendum)
Start aspirin 81 mg 1 tablet daily to protect your heart  Vaccines I recommend:  Shingrix (shingles)    GO TO THE LAB : Get the blood work     [place], [place] back for checkup in 4 months    Per our records you are due for your diabetic eye exam. Please contact your eye doctor to schedule an appointment. Please have them send copies of your office visit notes to [place]. Our fax number is [phone]. If you need a referral to an eye doctor please let us know.        Do you have a "Living will" or "[place] of attorney"? (Advance care planning documents)  If you already have a living will or healthcare power of attorney, is recommended you bring the copy to be scanned in your chart. The document will be available to all the doctors you see in the system.   Advance directives can be documented in many types of formats,  documents have names such as:  Lliving will  Durable power of attorney for healthcare (healthcare proxy or healthcare power of attorney)  Combined directives  Physician orders for life-sustaining treatment    More information at:  [email or web address]

## 2022-03-11 NOTE — Assessment & Plan Note (Signed)
-   Td 2023 - prevnar 2016;  pnm 23: 05-2016 - shingrix discussed   - covid vax : Up-to-date - Had a flu shot  Prostate Ca screening: +FH, father age 55; DRE normal, no symptoms, check PSA CCS: FH colon ca- GM FH colon polyps- mother  Cscope 05-2014, flex sig 09-2014 ok, s/p cscope 11-2017: WNL, next 2024 -Diet and exercise: reports doing well but + wt gain. To star a job soon, pt thinks that will help -Labs:  BMP AST ALT FLP A1c PSA - Healthcare POA discussed

## 2022-03-12 ENCOUNTER — Telehealth: Payer: Self-pay

## 2022-03-12 LAB — LIPID PANEL
Cholesterol: 234 mg/dL — ABNORMAL HIGH (ref <200)
HDL: 53 mg/dL (ref >=40)
LDL Cholesterol (Calc): 137 mg/dL (calc) — ABNORMAL HIGH
Non-HDL Cholesterol (Calc): 181 mg/dL (calc) — ABNORMAL HIGH (ref <130)
Total CHOL/HDL Ratio: 4.4 (calc) (ref <5.0)
Triglycerides: 310 mg/dL — ABNORMAL HIGH (ref <150)

## 2022-03-12 LAB — BASIC METABOLIC PANEL
BUN: 12 mg/dL (ref 7–25)
CO2: 23 mmol/L (ref 20–32)
Calcium: 10.1 mg/dL (ref 8.6–10.3)
Chloride: 98 mmol/L (ref 98–110)
Creat: 0.97 mg/dL (ref 0.70–1.30)
Glucose, Bld: 122 mg/dL — ABNORMAL HIGH (ref 65–99)
Potassium: 4.1 mmol/L (ref 3.5–5.3)
Sodium: 134 mmol/L — ABNORMAL LOW (ref 135–146)

## 2022-03-12 LAB — HEMOGLOBIN A1C
Hgb A1c MFr Bld: 6.3 % of total Hgb — ABNORMAL HIGH (ref <5.7)
Mean Plasma Glucose: 134 mg/dL
eAG (mmol/L): 7.4 mmol/L

## 2022-03-12 LAB — PSA: PSA: 0.56 ng/mL (ref <=4.00)

## 2022-03-12 LAB — ALT: ALT: 25 U/L (ref 9–46)

## 2022-03-12 LAB — AST: AST: 20 U/L (ref 10–35)

## 2022-03-12 NOTE — Telephone Encounter (Signed)
Received fax confirmation

## 2022-03-12 NOTE — Telephone Encounter (Signed)
Physical form completed and faxed back to [facility] at [phone]. Form sent for scanning.

## 2022-04-02 ENCOUNTER — Other Ambulatory Visit: Payer: Self-pay

## 2022-04-02 ENCOUNTER — Other Ambulatory Visit: Payer: Self-pay | Admitting: Internal Medicine

## 2022-04-02 DIAGNOSIS — E785 Hyperlipidemia, unspecified: Secondary | ICD-10-CM

## 2022-04-04 ENCOUNTER — Ambulatory Visit (HOSPITAL_BASED_OUTPATIENT_CLINIC_OR_DEPARTMENT_OTHER)
Admission: RE | Admit: 2022-04-04 | Discharge: 2022-04-04 | Disposition: A | Discharge status: Home / Self Care | Payer: 59 | Source: Ambulatory Visit | Attending: Internal Medicine | Admitting: Internal Medicine

## 2022-04-04 DIAGNOSIS — E785 Hyperlipidemia, unspecified: Secondary | ICD-10-CM | POA: Insufficient documentation

## 2022-07-01 ENCOUNTER — Encounter: Payer: Self-pay | Admitting: Internal Medicine

## 2022-07-24 ENCOUNTER — Other Ambulatory Visit: Payer: Self-pay | Admitting: Internal Medicine

## 2022-09-10 ENCOUNTER — Ambulatory Visit (INDEPENDENT_AMBULATORY_CARE_PROVIDER_SITE_OTHER): Payer: 59 | Admitting: Internal Medicine

## 2022-09-10 ENCOUNTER — Encounter: Payer: Self-pay | Admitting: Internal Medicine

## 2022-09-10 VITALS — BP 120/70 | HR 63 | Temp 97.4°F | Resp 16 | Ht 72.0 in | Wt 198.1 lb

## 2022-09-10 DIAGNOSIS — E1169 Type 2 diabetes mellitus with other specified complication: Secondary | ICD-10-CM | POA: Diagnosis not present

## 2022-09-10 DIAGNOSIS — Z7984 Long term (current) use of oral hypoglycemic drugs: Secondary | ICD-10-CM

## 2022-09-10 LAB — CBC WITH DIFFERENTIAL/PLATELET
Absolute Monocytes: 451 cells/uL (ref 200–950)
Basophils Relative: 0.8 %
HCT: 47.6 % (ref 38.5–50.0)
MCH: 31.3 pg (ref 27.0–33.0)
MCHC: 34 g/dL (ref 32.0–36.0)
MPV: 11.4 fL (ref 7.5–12.5)
RDW: 12.2 % (ref 11.0–15.0)

## 2022-09-10 LAB — BASIC METABOLIC PANEL
CO2: 27 mmol/L (ref 20–32)
Creat: 0.87 mg/dL (ref 0.70–1.30)
Sodium: 135 mmol/L (ref 135–146)

## 2022-09-10 LAB — HM DIABETES EYE EXAM

## 2022-09-10 MED ORDER — ACCU-CHEK SOFTCLIX LANCETS MISC: 12 refills | Status: DC

## 2022-09-10 MED ORDER — ACCU-CHEK AVIVA PLUS VI STRP: ORAL_STRIP | 12 refills | Status: DC

## 2022-09-10 NOTE — Patient Instructions (Addendum)
Vaccines I recommend: Shingrix (shingles) Flu shot this fall   Per our records you are due for your diabetic eye exam. Please contact your eye doctor to schedule an appointment. Please have them send copies of your office visit notes to [place]. Our fax number is [phone]. If you need a referral to an eye doctor please let us know.    GO TO THE LAB : Get the blood work     GO TO THE FRONT DESK, PLEASE SCHEDULE YOUR APPOINTMENTS Come back for  a physical by 02-2023

## 2022-09-10 NOTE — Progress Notes (Signed)
   Subjective:    Patient ID: Gregory Schlichting., male    DOB: 12-Jul-1965, 57 y.o.   MRN: [id number]  DOS:  09/10/2022 Type of visit - description: F/U, here with his wife  Feeling very well. Weight loss noted. Doing better with lifestyle.  Denies chest pain, difficulty breathing or palpitation. No diarrhea. No lower extremity paresthesias  Wt Readings from Last 3 Encounters:  09/10/22 198 lb 2 oz (89.9 kg)  03/11/22 212 lb 2 oz (96.2 kg)  10/28/21 207 lb 8 oz (94.1 kg)   Review of Systems See above   Past Medical History:  Diagnosis Date   Diabetes mellitus 2/09   Dyslipidemia 05/28/2016   Erectile dysfunction    Kidney stones 05/28/2016   Peyronie's disease     Past Surgical History:  Procedure Laterality Date   COLONOSCOPY     Kidney Stone Extraction     POLYPECTOMY      Current Outpatient Medications  Medication Instructions   Accu-Chek Softclix Lancets lancets CHECK BLOOD SUGAR NO MORE THAN TWICE DAILY   aspirin EC 81 mg, Oral, Daily, Swallow whole.   Blood Glucose Monitoring Suppl (ACCU-CHEK AVIVA PLUS) w/Device KIT Check blood sugar no more than twice daily   empagliflozin (JARDIANCE) 25 mg, Oral, Daily   glucose blood (ACCU-CHEK AVIVA PLUS) test strip Check blood sugar no more than twice daily   metFORMIN (GLUCOPHAGE) 1,000 mg, Oral, 2 times daily with meals   Multiple Vitamin (MULTIVITAMIN) tablet 1 tablet, Oral, Daily       Objective:   Physical Exam BP 120/70   Pulse 63   Temp (!) 97.4 F (36.3 C) (Oral)   Resp 16   Ht 6' (1.829 m)   Wt 198 lb 2 oz (89.9 kg)   SpO2 95%   BMI 26.87 kg/m  General:   Well developed, NAD, BMI noted. HEENT:  Normocephalic . Face symmetric, atraumatic Lungs:  CTA B Normal respiratory effort, no intercostal retractions, no accessory muscle use. Heart: RRR,  no murmur.  DM foot exam: No edema, good pedal pulses, pinprick examination normal Skin: Not pale. Not jaundice Neurologic:  alert & oriented X3.  Speech  normal, gait appropriate for age and unassisted Psych--  Cognition and judgment appear intact.  Cooperative with normal attention span and concentration.  Behavior appropriate. No anxious or depressed appearing.      Assessment     Assessment DM 2009 Dyslipidemia elevated TG Kidney stones Rectus diastasis Elevated LFTs, first noted 2014, on metformin for years . Hepatitis serology (-) 10-2015  PLAN: [id number] on Jardiance, metformin.  Significant weight loss noted, reports  moderate changes in his lifestyle but he has been very steady. Praised! Will check labs, anticipate A1c will be better. Foot exam negative. Dyslipidemia: Last FLP indicated he would benefit from statins but he has declined. Vaccine advice provided, see AVS. RTC CPX 02/2023

## 2022-09-10 NOTE — Assessment & Plan Note (Signed)
[id number] on Jardiance, metformin.  Significant weight loss noted, reports  moderate changes in his lifestyle but he has been very steady. Praised! Will check labs, anticipate A1c will be better. Foot exam negative. Dyslipidemia: Last FLP indicated he would benefit from statins but he has declined. Vaccine advice provided, see AVS. RTC CPX 02/2023

## 2022-09-11 LAB — CBC WITH DIFFERENTIAL/PLATELET
Basophils Absolute: 42 cells/uL (ref 0–200)
Eosinophils Absolute: 101 cells/uL (ref 15–500)
Eosinophils Relative: 1.9 %
Hemoglobin: 16.2 g/dL (ref 13.2–17.1)
Lymphs Abs: 2083 cells/uL (ref 850–3900)
MCV: 91.9 fL (ref 80.0–100.0)
Monocytes Relative: 8.5 %
Neutro Abs: 2624 cells/uL (ref 1500–7800)
Neutrophils Relative %: 49.5 %
Platelets: 205 10*3/uL (ref 140–400)
RBC: 5.18 10*6/uL (ref 4.20–5.80)
Total Lymphocyte: 39.3 %
WBC: 5.3 10*3/uL (ref 3.8–10.8)

## 2022-09-11 LAB — MICROALBUMIN / CREATININE URINE RATIO
Creatinine, Urine: 46 mg/dL (ref 20–320)
Microalb Creat Ratio: 11 mg/g creat (ref <30)
Microalb, Ur: 0.5 mg/dL

## 2022-09-11 LAB — BASIC METABOLIC PANEL
BUN: 11 mg/dL (ref 7–25)
Calcium: 10 mg/dL (ref 8.6–10.3)
Chloride: 98 mmol/L (ref 98–110)
Glucose, Bld: 106 mg/dL — ABNORMAL HIGH (ref 65–99)
Potassium: 4.1 mmol/L (ref 3.5–5.3)

## 2022-09-11 LAB — HEMOGLOBIN A1C
Hgb A1c MFr Bld: 6.1 % of total Hgb — ABNORMAL HIGH (ref <5.7)
Mean Plasma Glucose: 128 mg/dL
eAG (mmol/L): 7.1 mmol/L

## 2022-10-14 ENCOUNTER — Encounter: Payer: Self-pay | Admitting: Internal Medicine

## 2022-10-15 NOTE — Telephone Encounter (Signed)
I do ONE  physical exam a year  for each patient. He has diabetes, takes medications for it, needs to be seen every 6 months. I hope we won't get to the point that we need to dismiss him, he has been a patient for many years however if he becomes disrespectful with my staff then we will have to start the process. If needed, get Marchelle Folks involved

## 2022-11-08 ENCOUNTER — Other Ambulatory Visit: Payer: Self-pay | Admitting: Internal Medicine

## 2022-12-09 ENCOUNTER — Encounter: Payer: Self-pay | Admitting: Gastroenterology

## 2023-01-23 ENCOUNTER — Other Ambulatory Visit: Payer: Self-pay | Admitting: Internal Medicine

## 2023-03-17 ENCOUNTER — Encounter: Payer: Self-pay | Admitting: Internal Medicine

## 2023-03-17 ENCOUNTER — Ambulatory Visit (INDEPENDENT_AMBULATORY_CARE_PROVIDER_SITE_OTHER): Payer: 59 | Admitting: Internal Medicine

## 2023-03-17 VITALS — BP 126/80 | HR 67 | Temp 98.0°F | Resp 16 | Ht 72.0 in | Wt 201.0 lb

## 2023-03-17 DIAGNOSIS — E785 Hyperlipidemia, unspecified: Secondary | ICD-10-CM

## 2023-03-17 DIAGNOSIS — Z7984 Long term (current) use of oral hypoglycemic drugs: Secondary | ICD-10-CM

## 2023-03-17 DIAGNOSIS — E1169 Type 2 diabetes mellitus with other specified complication: Secondary | ICD-10-CM

## 2023-03-17 DIAGNOSIS — Z0000 Encounter for general adult medical examination without abnormal findings: Secondary | ICD-10-CM

## 2023-03-17 DIAGNOSIS — Z1211 Encounter for screening for malignant neoplasm of colon: Secondary | ICD-10-CM

## 2023-03-17 NOTE — Assessment & Plan Note (Signed)
Here for CPX  DM: Ambulatory blood sugars around 110, on Jardiance, metformin, check labs. Dyslipidemia: Patient has been consistently reluctant to take medication, fortunately a coronary calcium score 03-2022 was 0.  Reassess the situation periodically. RTC 6 months

## 2023-03-17 NOTE — Progress Notes (Signed)
Subjective:    Patient ID: Gregory Williams., male    DOB: December 08, 1965, 57 y.o.   MRN: [id number]  DOS:  03/17/2023 Type of visit - description: cpx, here with his wife Doing well, no concerns.  Wt Readings from Last 3 Encounters:  03/17/23 201 lb (91.2 kg)  09/10/22 198 lb 2 oz (89.9 kg)  03/11/22 212 lb 2 oz (96.2 kg)    Review of Systems  A 14 point review of systems is negative    Past Medical History:  Diagnosis Date   Diabetes mellitus 2/09   Dyslipidemia 05/28/2016   Erectile dysfunction    Kidney stones 05/28/2016   Peyronie's disease     Past Surgical History:  Procedure Laterality Date   COLONOSCOPY     Kidney Stone Extraction     POLYPECTOMY     Social History   Socioeconomic History   Marital status: Married    Spouse name: Britta Mccreedy   Number of children: 3   Years of education: Not on file   Highest education level: Not on file  Occupational History   Occupation: Has a desk job  Tobacco Use   Smoking status: Never   Smokeless tobacco: Never  Substance and Sexual Activity   Alcohol use: Not Currently    Alcohol/week: 0.0 standard drinks of alcohol   Drug use: No   Sexual activity: Yes    Partners: Female  Other Topics Concern   Not on file  Social History Narrative   Ok to call results to his wife Britta Mccreedy   [phone]   Household: pt, wife, one daughter    Social Drivers of Corporate investment banker Strain: Not on file  Food Insecurity: Not on file  Transportation Needs: Not on file  Physical Activity: Not on file  Stress: Not on file  Social Connections: Not on file  Intimate Partner Violence: Not on file    Current Outpatient Medications  Medication Instructions   Accu-Chek Softclix Lancets lancets CHECK BLOOD SUGAR NO MORE THAN TWICE DAILY   aspirin EC 81 mg, Daily   Blood Glucose Monitoring Suppl (ACCU-CHEK AVIVA PLUS) w/Device KIT Check blood sugar no more than twice daily   empagliflozin (JARDIANCE) 25 mg, Oral, Daily    glucose blood (ACCU-CHEK AVIVA PLUS) test strip Check blood sugar no more than twice daily   metFORMIN (GLUCOPHAGE) 1,000 mg, Oral, 2 times daily with meals   Multiple Vitamin (MULTIVITAMIN) tablet 1 tablet, Daily       Objective:   Physical Exam BP 126/80   Pulse 67   Temp 98 F (36.7 C) (Oral)   Resp 16   Ht 6' (1.829 m)   Wt 201 lb (91.2 kg)   SpO2 97%   BMI 27.26 kg/m  General: Well developed, NAD, BMI noted Neck: No  thyromegaly  HEENT:  Normocephalic . Face symmetric, atraumatic Lungs:  CTA B Normal respiratory effort, no intercostal retractions, no accessory muscle use. Heart: RRR,  no murmur.  Abdomen:  Not distended, soft, non-tender. No rebound or rigidity.   Lower extremities: no pretibial edema bilaterally  Skin: Exposed areas without rash. Not pale. Not jaundice Neurologic:  alert & oriented X3.  Speech normal, gait appropriate for age and unassisted Strength symmetric and appropriate for age.  Psych: Cognition and judgment appear intact.  Cooperative with normal attention span and concentration.  Behavior appropriate. No anxious or depressed appearing.     Assessment    Assessment DM 2009 Dyslipidemia elevated TG  Kidney stones Rectus diastasis Elevated LFTs, first noted 2014, on metformin for years . Hepatitis serology (-) 10-2015 Ca+ Coronary score 03/2022: ZERO   PLAN: Here for CPX -Td 2023; prevnar 2016;  pnm 23: 05-2016 -To have a flu shot and a covid booster at the pharmacy - Vaccines I recommend:  Shingrix. Prostate Ca screening: +FH, father age 41; no symptoms, check PSA CCS: FH colon ca- GM FH colon polyps- mother  Cscope 05-2014, flex sig 09-2014 ok, s/p cscope 11-2017: WNL, next due , pt aware and plans to proceed  -Diet and exercise: Doing well, has developed a healthy routine -Labs: CMP FLP A1c TSH PSA - Healthcare POA: Information provided DM: Ambulatory blood sugars around 110, on Jardiance, metformin, check labs. Dyslipidemia:  Patient has been consistently reluctant to take medication, fortunately a coronary calcium score 03-2022 was 0.  Reassess the situation periodically. RTC 6 months

## 2023-03-17 NOTE — Patient Instructions (Addendum)
Vaccines I recommend: Flu and COVID booster Shingrix (shingles)     GO TO THE LAB : Get the blood work     Next visit with me in 6 months    Please schedule it at the front desk       "Health Care Power of attorney" ,  "Living will" (Advance care planning documents)  If you already have a living will or healthcare power of attorney, is recommended you bring the copy to be scanned in your chart.   The document will be available to all the doctors you see in the system.  Advance care planning is a process that supports adults in  understanding and sharing their preferences regarding future medical care.  The patient's preferences are recorded in documents called Advance Directives and the can be modified at any time while the patient is in full mental capacity.   If you don't have one, please consider create one.      More information at: [email or web address]

## 2023-03-17 NOTE — Assessment & Plan Note (Signed)
Here for CPX -Td 2023; prevnar 2016;  pnm 23: 05-2016 -To have a flu shot and a covid booster at the pharmacy - Vaccines I recommend:  Shingrix. Prostate Ca screening: +FH, father age 56; no symptoms, check PSA CCS: FH colon ca- GM FH colon polyps- mother  Cscope 05-2014, flex sig 09-2014 ok, s/p cscope 11-2017: WNL, next due , pt aware and plans to proceed  -Diet and exercise: Doing well, has developed a healthy routine -Labs: CMP FLP A1c TSH PSA - Healthcare POA: Information provided

## 2023-03-18 LAB — LIPID PANEL
Cholesterol: 231 mg/dL — ABNORMAL HIGH (ref <200)
HDL: 59 mg/dL (ref >=40)
LDL Cholesterol (Calc): 136 mg/dL — ABNORMAL HIGH
Non-HDL Cholesterol (Calc): 172 mg/dL — ABNORMAL HIGH (ref <130)
Total CHOL/HDL Ratio: 3.9 (calc) (ref <5.0)
Triglycerides: 221 mg/dL — ABNORMAL HIGH (ref <150)

## 2023-03-18 LAB — COMPREHENSIVE METABOLIC PANEL
AG Ratio: 1.9 (calc) (ref 1.0–2.5)
ALT: 13 U/L (ref 9–46)
AST: 15 U/L (ref 10–35)
Albumin: 5 g/dL (ref 3.6–5.1)
Alkaline phosphatase (APISO): 47 U/L (ref 35–144)
BUN: 14 mg/dL (ref 7–25)
CO2: 30 mmol/L (ref 20–32)
Calcium: 9.8 mg/dL (ref 8.6–10.3)
Chloride: 99 mmol/L (ref 98–110)
Creat: 0.97 mg/dL (ref 0.70–1.30)
Globulin: 2.6 g/dL (ref 1.9–3.7)
Glucose, Bld: 117 mg/dL — ABNORMAL HIGH (ref 65–99)
Potassium: 4.3 mmol/L (ref 3.5–5.3)
Sodium: 136 mmol/L (ref 135–146)
Total Bilirubin: 0.7 mg/dL (ref 0.2–1.2)
Total Protein: 7.6 g/dL (ref 6.1–8.1)

## 2023-03-18 LAB — HEMOGLOBIN A1C
Hgb A1c MFr Bld: 6.1 %{Hb} — ABNORMAL HIGH (ref <5.7)
Mean Plasma Glucose: 128 mg/dL
eAG (mmol/L): 7.1 mmol/L

## 2023-03-18 LAB — TSH: TSH: 2.01 m[IU]/L (ref 0.40–4.50)

## 2023-03-18 LAB — PSA: PSA: 0.4 ng/mL (ref <=4.00)

## 2023-03-23 ENCOUNTER — Telehealth: Payer: Self-pay

## 2023-03-23 NOTE — Telephone Encounter (Signed)
Physical form completed and faxed back to [facility] at [phone]. Form sent for scanning.

## 2023-04-07 ENCOUNTER — Encounter: Payer: Self-pay | Admitting: Optometry

## 2023-05-13 ENCOUNTER — Other Ambulatory Visit: Payer: Self-pay | Admitting: Internal Medicine

## 2023-06-22 ENCOUNTER — Encounter: Payer: Self-pay | Admitting: Gastroenterology

## 2023-07-23 ENCOUNTER — Other Ambulatory Visit: Payer: Self-pay | Admitting: Internal Medicine

## 2023-08-14 ENCOUNTER — Ambulatory Visit (AMBULATORY_SURGERY_CENTER): Admitting: *Deleted

## 2023-08-14 VITALS — Ht 72.0 in | Wt 193.0 lb

## 2023-08-14 DIAGNOSIS — Z800 Family history of malignant neoplasm of digestive organs: Secondary | ICD-10-CM

## 2023-08-14 DIAGNOSIS — Z83719 Family history of colon polyps, unspecified: Secondary | ICD-10-CM

## 2023-08-14 DIAGNOSIS — Z860100 Personal history of colon polyps, unspecified: Secondary | ICD-10-CM

## 2023-08-14 MED ORDER — NA SULFATE-K SULFATE-MG SULF 17.5-3.13-1.6 GM/177ML PO SOLN: 1.0000 | Freq: Once | ORAL | 0 refills | Status: AC

## 2023-08-14 NOTE — Progress Notes (Signed)
 Pt's name and DOB verified at the beginning of the pre-visit wit 2 identifiers  Pt denies any difficulty with ambulating,sitting, laying down or rolling side to side  Pt has no issues moving head neck or swallowing  No egg or soy allergy known to patient   No issues known to pt with past sedation with any surgeries or procedures  No FH of Malignant Hyperthermia  Pt is not on home 02   Pt is not on blood thinners   Pt denies issues with constipation   Pt is not on dialysis  Pt denise any abnormal heart rhythms   Pt denies any upcoming cardiac testing  Patient's chart reviewed by Rogena Class CNRA prior to pre-visit and patient appropriate for the [facility].  Pre-visit completed and red dot placed by patient's name on their procedure day (on provider's schedule).    Visit by phone  Pt states weight is 193 lb  IInstructions reviewed. Pt given  both [facility] main # and MD on call # prior to instructions.  Pt states understanding of instructions. Instructed pt to review instructions again prior to procedure and call main # given if has questions.. Pt states they will.   Instructed pt on where to find instructions on My Chart.

## 2023-08-28 ENCOUNTER — Encounter: Admitting: Gastroenterology

## 2023-08-31 ENCOUNTER — Encounter: Admitting: Gastroenterology

## 2023-09-10 ENCOUNTER — Encounter: Payer: Self-pay | Admitting: Gastroenterology

## 2023-09-10 LAB — HM DIABETES EYE EXAM

## 2023-09-14 ENCOUNTER — Ambulatory Visit: Payer: 59 | Admitting: Internal Medicine

## 2023-09-14 ENCOUNTER — Encounter: Payer: Self-pay | Admitting: Internal Medicine

## 2023-09-21 ENCOUNTER — Other Ambulatory Visit: Payer: Self-pay | Admitting: Internal Medicine

## 2023-10-09 ENCOUNTER — Encounter: Payer: Self-pay | Admitting: Gastroenterology

## 2023-10-09 ENCOUNTER — Ambulatory Visit (AMBULATORY_SURGERY_CENTER): Admitting: Gastroenterology

## 2023-10-09 VITALS — BP 109/67 | HR 56 | Temp 97.5°F | Resp 10 | Ht 72.0 in | Wt 193.0 lb

## 2023-10-09 DIAGNOSIS — K635 Polyp of colon: Secondary | ICD-10-CM | POA: Diagnosis not present

## 2023-10-09 DIAGNOSIS — Z800 Family history of malignant neoplasm of digestive organs: Secondary | ICD-10-CM

## 2023-10-09 DIAGNOSIS — Z83719 Family history of colon polyps, unspecified: Secondary | ICD-10-CM

## 2023-10-09 DIAGNOSIS — Z860100 Personal history of colon polyps, unspecified: Secondary | ICD-10-CM

## 2023-10-09 DIAGNOSIS — Z1211 Encounter for screening for malignant neoplasm of colon: Secondary | ICD-10-CM | POA: Diagnosis present

## 2023-10-09 DIAGNOSIS — D125 Benign neoplasm of sigmoid colon: Secondary | ICD-10-CM

## 2023-10-09 DIAGNOSIS — K640 First degree hemorrhoids: Secondary | ICD-10-CM

## 2023-10-09 MED ORDER — SODIUM CHLORIDE 0.9 % IV SOLN: 500.0000 mL | Freq: Once | INTRAVENOUS | Status: DC

## 2023-10-09 NOTE — Progress Notes (Signed)
Vitals-DT  Pt's states no medical or surgical changes since previsit or office visit.   Pt's states no medical or surgical changes since previsit or office visit.

## 2023-10-09 NOTE — Progress Notes (Signed)
 Called to room to assist during endoscopic procedure.  Patient ID and intended procedure confirmed with present staff. Received instructions for my participation in the procedure from the performing physician.

## 2023-10-09 NOTE — Patient Instructions (Signed)
 High fiber diet, us  FiberCon 1-2 tablets PO daily Continue present medications  Await pathology results Repeat colonoscopy in 5 years for surveillance  See handout for polyps  YOU HAD AN ENDOSCOPIC PROCEDURE TODAY AT THE [facility]:   Refer to the procedure report that was given to you for any specific questions about what was found during the examination.  If the procedure report does not answer your questions, please call your gastroenterologist to clarify.  If you requested that your care partner not be given the details of your procedure findings, then the procedure report has been included in a sealed envelope for you to review at your convenience later.  YOU SHOULD EXPECT: Some feelings of bloating in the abdomen. Passage of more gas than usual.  Walking can help get rid of the air that was put into your GI tract during the procedure and reduce the bloating. If you had a lower endoscopy (such as a colonoscopy or flexible sigmoidoscopy) you may notice spotting of blood in your stool or on the toilet paper. If you underwent a bowel prep for your procedure, you may not have a normal bowel movement for a few days.  Please Note:  You might notice some irritation and congestion in your nose or some drainage.  This is from the oxygen used during your procedure.  There is no need for concern and it should clear up in a day or so.  SYMPTOMS TO REPORT IMMEDIATELY:  Following lower endoscopy (colonoscopy or flexible sigmoidoscopy):  Excessive amounts of blood in the stool  Significant tenderness or worsening of abdominal pains  Swelling of the abdomen that is new, acute  Fever of 100F or higher  For urgent or emergent issues, a gastroenterologist can be reached at any hour by calling [phone]. Do not use MyChart messaging for urgent concerns.   DIET:  We do recommend a small meal at first, but then you may proceed to your regular diet.  Drink plenty of fluids but you should  avoid alcoholic beverages for 24 hours.  ACTIVITY:  You should plan to take it easy for the rest of today and you should NOT DRIVE or use heavy machinery until tomorrow (because of the sedation medicines used during the test).    FOLLOW UP: Our staff will call the number listed on your records the next business day following your procedure.  We will call around 7:15- 8:00 am to check on you and address any questions or concerns that you may have regarding the information given to you following your procedure. If we do not reach you, we will leave a message.     If any biopsies were taken you will be contacted by phone or by letter within the next 1-3 weeks.  Please call us  at [phone] if you have not heard about the biopsies in 3 weeks.   SIGNATURES/CONFIDENTIALITY: You and/or your care partner have signed paperwork which will be entered into your electronic medical record.  These signatures attest to the fact that that the information above on your After Visit Summary has been reviewed and is understood.  Full responsibility of the confidentiality of this discharge information lies with you and/or your care-partner.

## 2023-10-09 NOTE — Progress Notes (Signed)
 GASTROENTEROLOGY PROCEDURE H&P NOTE   Primary Care Physician: Amon Aloysius BRAVO, MD  HPI: Gregory Williams. is a 58 y.o. male who presents for Colonoscopy for history of prior advanced adenomas.   Past Medical History:  Diagnosis Date   Diabetes mellitus 2/09   Dyslipidemia 05/28/2016   Erectile dysfunction    Kidney stones 05/28/2016   Peyronie's disease    Past Surgical History:  Procedure Laterality Date   COLONOSCOPY     Kidney Stone Extraction     POLYPECTOMY     Current Outpatient Medications  Medication Sig Dispense Refill   Accu-Chek Softclix Lancets lancets CHECK BLOOD SUGAR NO MORE THAN TWICE DAILY 100 each 12   Blood Glucose Monitoring Suppl (ACCU-CHEK AVIVA PLUS) w/Device KIT Check blood sugar no more than twice daily 1 kit 0   empagliflozin  (JARDIANCE ) 25 MG TABS tablet TAKE 1 TABLET (25 MG TOTAL) BY MOUTH DAILY. 90 tablet 0   glucose blood (ACCU-CHEK AVIVA PLUS) test strip Check blood sugar no more than twice daily. Needs appt 100 strip 0   metFORMIN  (GLUCOPHAGE ) 1000 MG tablet Take 1 tablet (1,000 mg total) by mouth 2 (two) times daily with a meal. 180 tablet 1   Multiple Vitamin (MULTIVITAMIN) tablet Take 1 tablet by mouth daily.     aspirin EC 81 MG tablet Take 81 mg by mouth daily. Swallow whole. (Patient not taking: Reported on 08/14/2023)     Current Facility-Administered Medications  Medication Dose Route Frequency Provider Last Rate Last Admin   0.9 %  sodium chloride  infusion  500 mL Intravenous Once [name], Mace Weinberg Jr., MD        Current Outpatient Medications:    Accu-Chek Softclix Lancets lancets, CHECK BLOOD SUGAR NO MORE THAN TWICE DAILY, Disp: 100 each, Rfl: 12   Blood Glucose Monitoring Suppl (ACCU-CHEK AVIVA PLUS) w/Device KIT, Check blood sugar no more than twice daily, Disp: 1 kit, Rfl: 0   empagliflozin  (JARDIANCE ) 25 MG TABS tablet, TAKE 1 TABLET (25 MG TOTAL) BY MOUTH DAILY., Disp: 90 tablet, Rfl: 0   glucose blood (ACCU-CHEK AVIVA PLUS)  test strip, Check blood sugar no more than twice daily. Needs appt, Disp: 100 strip, Rfl: 0   metFORMIN  (GLUCOPHAGE ) 1000 MG tablet, Take 1 tablet (1,000 mg total) by mouth 2 (two) times daily with a meal., Disp: 180 tablet, Rfl: 1   Multiple Vitamin (MULTIVITAMIN) tablet, Take 1 tablet by mouth daily., Disp: , Rfl:    aspirin EC 81 MG tablet, Take 81 mg by mouth daily. Swallow whole. (Patient not taking: Reported on 08/14/2023), Disp: , Rfl:   Current Facility-Administered Medications:    0.9 %  sodium chloride  infusion, 500 mL, Intravenous, Once, [name], [name], MD Not on File Family History  Problem Relation Age of Onset   Colon polyps Mother        in her 62s   Hypertension Mother    Breast cancer Mother    Prostate cancer Father 3   Diabetes Father    Parkinsonism Father    CAD Sister    Colon cancer Maternal Grandmother        dx late in life   Diabetes Paternal Aunt    Diabetes Other        father family   Stroke Neg Hx    Heart attack Neg Hx    Esophageal cancer Neg Hx    Rectal cancer Neg Hx    Stomach cancer Neg Hx    Social History  Socioeconomic History   Marital status: Married    Spouse name: [name]   Number of children: 3   Years of education: Not on file   Highest education level: Not on file  Occupational History   Occupation: Has a desk job  Tobacco Use   Smoking status: Never   Smokeless tobacco: Never  Vaping Use   Vaping status: Never Used  Substance and Sexual Activity   Alcohol use: Not Currently    Alcohol/week: 0.0 standard drinks of alcohol   Drug use: No   Sexual activity: Yes    Partners: Female  Other Topics Concern   Not on file  Social History Narrative   Ok to call results to his wife [name]   [phone]   Household: pt, wife, one daughter    Social Drivers of Corporate investment banker Strain: Not on file  Food Insecurity: Not on file  Transportation Needs: Not on file  Physical Activity: Not on file   Stress: Not on file  Social Connections: Not on file  Intimate Partner Violence: Not on file    Physical Exam: Today's Vitals   10/09/23 0716  BP: 124/71  Pulse: 70  Temp: (!) 97.5 F (36.4 C)  TempSrc: Temporal  SpO2: 98%  Weight: 193 lb (87.5 kg)  Height: 6' (1.829 m)  PainSc: 0-No pain   Body mass index is 26.18 kg/m. GEN: NAD EYE: Sclerae anicteric ENT: MMM CV: Non-tachycardic GI: Soft, NT/ND NEURO:  Alert & Oriented x 3  Lab Results: No results for input(s): WBC, HGB, HCT, PLT in the last 72 hours. BMET No results for input(s): NA, K, CL, CO2, GLUCOSE, BUN, CREATININE, CALCIUM  in the last 72 hours. LFT No results for input(s): PROT, ALBUMIN, AST, ALT, ALKPHOS, BILITOT, BILIDIR, IBILI in the last 72 hours. PT/INR No results for input(s): LABPROT, INR in the last 72 hours.   Impression / Plan: This is a 58 y.o.male who presents for Colonoscopy for history of prior advanced adenomas.   The risks and benefits of endoscopic evaluation/treatment were discussed with the patient and/or family; these include but are not limited to the risk of perforation, infection, bleeding, missed lesions, lack of diagnosis, severe illness requiring hospitalization, as well as anesthesia and sedation related illnesses.  The patient's history has been reviewed, patient examined, no change in status, and deemed stable for procedure.  The patient and/or family is agreeable to proceed.    Aloha Finner, MD [facility] Advanced [facility]oscopy Office # [phone]

## 2023-10-09 NOTE — Progress Notes (Signed)
 Sedate, gd SR, tolerated procedure well, VSS, report to RN

## 2023-10-09 NOTE — Op Note (Signed)
 [facility] Patient Name: Gregory Williams Procedure Date: 10/09/2023 7:53 AM MRN: [id number] Endoscopist: Aloha Finner , MD, [id number] Age: 58 Referring MD:  Date of Birth: [birth year] Gender: Male Account #: [id number] Procedure:                Colonoscopy Indications:              Surveillance: Personal history of adenomatous                            polyps on last colonoscopy > 5 years ago, High risk                            colon cancer surveillance: Personal history of                            adenoma (10 mm or greater in size), High risk colon                            cancer surveillance: Personal history of adenoma                            less than 10 mm in size Medicines:                Monitored Anesthesia Care Procedure:                Pre-Anesthesia Assessment:                           - Prior to the procedure, a History and Physical                            was performed, and patient medications and                            allergies were reviewed. The patient's tolerance of                            previous anesthesia was also reviewed. The risks                            and benefits of the procedure and the sedation                            options and risks were discussed with the patient.                            All questions were answered, and informed consent                            was obtained. Prior Anticoagulants: The patient has                            taken no anticoagulant or antiplatelet agents  except for aspirin. ASA Grade Assessment: II - A                            patient with mild systemic disease. After reviewing                            the risks and benefits, the patient was deemed in                            satisfactory condition to undergo the procedure.                           After obtaining informed consent, the colonoscope                            was passed under  direct vision. Throughout the                            procedure, the patient's blood pressure, pulse, and                            oxygen saturations were monitored continuously. The                            CF [id number] #[id number] was introduced through the anus                            and advanced to the 3 cm into the ileum. The                            colonoscopy was performed without difficulty. The                            patient tolerated the procedure poorly due to the                            patient's discomfort during the procedure. The                            quality of the bowel preparation was good. The                            terminal ileum, ileocecal valve, appendiceal                            orifice, and rectum were photographed. Scope In: 7:58:57 AM Scope Out: 8:11:04 AM Scope Withdrawal Time: 0 hours 9 minutes 56 seconds  Total Procedure Duration: 0 hours 12 minutes 7 seconds  Findings:                 The digital rectal exam was normal. Pertinent                            negatives include no  palpable rectal lesions.                           The terminal ileum and ileocecal valve appeared                            normal.                           A 5 mm polyp was found in the sigmoid colon. The                            polyp was sessile. The polyp was removed with a                            cold snare. Resection and retrieval were complete.                           A tattoo was seen in the sigmoid colon. The tattoo                            site appeared normal and there was no appearance of                            any recurrent polyp.                           Normal mucosa was found in the entire colon                            otherwise.                           Non-bleeding non-thrombosed internal hemorrhoids                            were found during retroflexion, during perianal                            exam and during  digital exam. The hemorrhoids were                            Grade I (internal hemorrhoids that do not prolapse). Complications:            No immediate complications. Estimated Blood Loss:     Estimated blood loss was minimal. Impression:               - The examined portion of the ileum was normal.                           - One 5 mm polyp in the sigmoid colon, removed with                            a cold snare. Resected and retrieved.                           -  A tattoo was seen in the sigmoid colon. The                            tattoo site appeared normal and there was no                            appearance of any recurrent polyp.                           - Normal mucosa in the entire examined colon                            otherwise.                           - Non-bleeding non-thrombosed internal hemorrhoids. Recommendation:           - The patient will be observed post-procedure,                            until all discharge criteria are met.                           - Discharge patient to home.                           - Patient has a contact number available for                            emergencies. The signs and symptoms of potential                            delayed complications were discussed with the                            patient. Return to normal activities tomorrow.                            Written discharge instructions were provided to the                            patient.                           - High fiber diet.                           - Use FiberCon 1-2 tablets PO daily.                           - Continue present medications.                           - Await pathology results.                           - Repeat colonoscopy in  5 years for surveillance                            (no matter pathology as result of previous advanced                            adenoma resections in the past).                           - The findings and  recommendations were discussed                            with the patient.                           - The findings and recommendations were discussed                            with the patient's family. Aloha Finner, MD 10/09/2023 8:16:54 AM

## 2023-10-12 ENCOUNTER — Telehealth: Payer: Self-pay

## 2023-10-12 NOTE — Telephone Encounter (Signed)
 Left message on follow up call.

## 2023-10-13 LAB — SURGICAL PATHOLOGY

## 2023-10-15 ENCOUNTER — Ambulatory Visit: Payer: Self-pay | Admitting: Gastroenterology

## 2023-10-18 ENCOUNTER — Other Ambulatory Visit: Payer: Self-pay | Admitting: Internal Medicine

## 2023-11-08 ENCOUNTER — Other Ambulatory Visit: Payer: Self-pay | Admitting: Internal Medicine

## 2023-11-13 ENCOUNTER — Other Ambulatory Visit: Payer: Self-pay | Admitting: Internal Medicine

## 2023-12-08 ENCOUNTER — Other Ambulatory Visit: Payer: Self-pay | Admitting: Internal Medicine

## 2023-12-08 NOTE — Telephone Encounter (Signed)
 Last OV 03/17/23. PCP wanted to see him back in 6 months. Pt informed in August he was due for visit, he then scheduled OV for December 2025. Rx denied.

## 2023-12-17 ENCOUNTER — Other Ambulatory Visit: Payer: Self-pay | Admitting: Internal Medicine

## 2023-12-18 NOTE — Telephone Encounter (Signed)
 Refill request for metformin  1000mg . I sent Pt a mychart messge on 11/08/23 informing he is overdue for a follow-up on his diabetes. Last OV 03/17/23- note stated to return to clinic in 6 months (June 2025). Pt responded stating his insurance only pays for him to come into the office once yearly (?), he then scheduled a visit for the end of December 2025. I messaged him again informing he needed to be seen, that we could not continue refilling meds w/o routine care on his diabetes, Pt messaged back stating he now couldn't get off work until December 2025. Please advise.

## 2023-12-18 NOTE — Telephone Encounter (Signed)
 Send one month supply, needs office visit.

## 2024-01-01 ENCOUNTER — Other Ambulatory Visit: Payer: Self-pay | Admitting: Internal Medicine

## 2024-01-22 ENCOUNTER — Other Ambulatory Visit: Payer: Self-pay | Admitting: Family

## 2024-01-26 ENCOUNTER — Other Ambulatory Visit: Payer: Self-pay | Admitting: Internal Medicine

## 2024-02-04 ENCOUNTER — Other Ambulatory Visit: Payer: Self-pay | Admitting: Internal Medicine

## 2024-02-04 ENCOUNTER — Encounter: Payer: Self-pay | Admitting: Internal Medicine

## 2024-02-04 DIAGNOSIS — E1169 Type 2 diabetes mellitus with other specified complication: Secondary | ICD-10-CM

## 2024-02-04 MED ORDER — EMPAGLIFLOZIN 25 MG PO TABS: 25.0000 mg | ORAL_TABLET | Freq: Every day | ORAL | 1 refills | Status: DC

## 2024-02-24 ENCOUNTER — Other Ambulatory Visit: Payer: Self-pay | Admitting: Internal Medicine

## 2024-03-14 ENCOUNTER — Encounter: Payer: Self-pay | Admitting: Internal Medicine

## 2024-03-14 DIAGNOSIS — E1169 Type 2 diabetes mellitus with other specified complication: Secondary | ICD-10-CM

## 2024-03-14 DIAGNOSIS — E785 Hyperlipidemia, unspecified: Secondary | ICD-10-CM

## 2024-03-14 DIAGNOSIS — Z0000 Encounter for general adult medical examination without abnormal findings: Secondary | ICD-10-CM

## 2024-03-14 NOTE — Addendum Note (Signed)
 Addended by: Daija Routson D on: 03/14/2024 04:42 PM   Modules accepted: Orders

## 2024-03-14 NOTE — Telephone Encounter (Signed)
 Okay to proceed with blood work, let him know: CMP FLP CBC A1c micro PSA Dx: CPX, DM

## 2024-03-16 ENCOUNTER — Other Ambulatory Visit (INDEPENDENT_AMBULATORY_CARE_PROVIDER_SITE_OTHER)

## 2024-03-16 DIAGNOSIS — Z0000 Encounter for general adult medical examination without abnormal findings: Secondary | ICD-10-CM

## 2024-03-16 DIAGNOSIS — E785 Hyperlipidemia, unspecified: Secondary | ICD-10-CM

## 2024-03-16 DIAGNOSIS — E1169 Type 2 diabetes mellitus with other specified complication: Secondary | ICD-10-CM

## 2024-03-16 LAB — MICROALBUMIN / CREATININE URINE RATIO
Creatinine,U: 88.4 mg/dL
Microalb Creat Ratio: UNDETERMINED mg/g (ref 0.0–30.0)
Microalb, Ur: <0.7 mg/dL

## 2024-03-16 NOTE — Addendum Note (Signed)
 Addended by: TRUDY CURVIN RAMAN on: 03/16/2024 09:45 AM   Modules accepted: Orders

## 2024-03-16 NOTE — Addendum Note (Signed)
 Addended by: TRUDY CURVIN RAMAN on: 03/16/2024 10:58 AM   Modules accepted: Orders

## 2024-03-16 NOTE — Addendum Note (Signed)
 Addended by: TRUDY CURVIN RAMAN on: 03/16/2024 10:45 AM   Modules accepted: Orders

## 2024-03-17 LAB — CBC WITH DIFFERENTIAL/PLATELET
Basophils Absolute: 0 x10E3/uL (ref 0.0–0.2)
Basos: 1 %
EOS (ABSOLUTE): 0.2 x10E3/uL (ref 0.0–0.4)
Eos: 2 %
Hematocrit: 48.1 % (ref 37.5–51.0)
Hemoglobin: 16 g/dL (ref 13.0–17.7)
Immature Grans (Abs): 0 x10E3/uL (ref 0.0–0.1)
Immature Granulocytes: 0 %
Lymphocytes Absolute: 2.9 x10E3/uL (ref 0.7–3.1)
Lymphs: 40 %
MCH: 31.4 pg (ref 26.6–33.0)
MCHC: 33.3 g/dL (ref 31.5–35.7)
MCV: 94 fL (ref 79–97)
Monocytes Absolute: 0.6 x10E3/uL (ref 0.1–0.9)
Monocytes: 9 %
Neutrophils Absolute: 3.5 x10E3/uL (ref 1.4–7.0)
Neutrophils: 48 %
Platelets: 213 x10E3/uL (ref 150–450)
RBC: 5.1 x10E6/uL (ref 4.14–5.80)
RDW: 12.5 % (ref 11.6–15.4)
WBC: 7.2 x10E3/uL (ref 3.4–10.8)

## 2024-03-17 LAB — COMPREHENSIVE METABOLIC PANEL WITH GFR
ALT: 15 IU/L (ref 0–44)
AST: 16 IU/L (ref 0–40)
Albumin: 4.6 g/dL (ref 3.8–4.9)
Alkaline Phosphatase: 55 IU/L (ref 47–123)
BUN/Creatinine Ratio: 12 (ref 9–20)
BUN: 11 mg/dL (ref 6–24)
Bilirubin Total: 0.7 mg/dL (ref 0.0–1.2)
CO2: 23 mmol/L (ref 20–29)
Calcium: 9.9 mg/dL (ref 8.7–10.2)
Chloride: 96 mmol/L (ref 96–106)
Creatinine, Ser: 0.89 mg/dL (ref 0.76–1.27)
Globulin, Total: 2.4 g/dL (ref 1.5–4.5)
Glucose: 106 mg/dL — ABNORMAL HIGH (ref 70–99)
Potassium: 4.4 mmol/L (ref 3.5–5.2)
Sodium: 135 mmol/L (ref 134–144)
Total Protein: 7 g/dL (ref 6.0–8.5)
eGFR: 100 mL/min/1.73

## 2024-03-17 LAB — LIPID PANEL
Chol/HDL Ratio: 4.1 ratio (ref 0.0–5.0)
Cholesterol, Total: 192 mg/dL (ref 100–199)
HDL: 47 mg/dL
LDL Chol Calc (NIH): 113 mg/dL — ABNORMAL HIGH (ref 0–99)
Triglycerides: 184 mg/dL — ABNORMAL HIGH (ref 0–149)
VLDL Cholesterol Cal: 32 mg/dL (ref 5–40)

## 2024-03-17 LAB — HEMOGLOBIN A1C
Est. average glucose Bld gHb Est-mCnc: 134 mg/dL
Hgb A1c MFr Bld: 6.3 % — ABNORMAL HIGH (ref 4.8–5.6)

## 2024-03-17 LAB — PSA: Prostate Specific Ag, Serum: 0.6 ng/mL (ref 0.0–4.0)

## 2024-03-18 ENCOUNTER — Encounter: Payer: Self-pay | Admitting: Internal Medicine

## 2024-03-18 ENCOUNTER — Ambulatory Visit: Admitting: Internal Medicine

## 2024-03-18 ENCOUNTER — Ambulatory Visit: Payer: Self-pay | Admitting: Internal Medicine

## 2024-03-18 VITALS — BP 134/90 | HR 63 | Temp 97.9°F | Ht 72.0 in | Wt 207.2 lb

## 2024-03-18 DIAGNOSIS — Z0000 Encounter for general adult medical examination without abnormal findings: Secondary | ICD-10-CM | POA: Diagnosis not present

## 2024-03-18 DIAGNOSIS — Z23 Encounter for immunization: Secondary | ICD-10-CM

## 2024-03-18 NOTE — Progress Notes (Signed)
 "  Subjective:    Patient ID: Gregory Tutson., male    DOB: 10-Nov-1965, 58 y.o.   MRN: [id number]  DOS:  03/18/2024 Here for CPX  Discussed the use of AI scribe software for clinical note transcription with the patient, who gave verbal consent to proceed.  History of Present Illness Gregory Williams. is a 58 year old male who presents for an annual physical exam.  He is here with his wife.  General health status - Feels well with no specific concerns  Blood pressure evaluation - Recent blood pressure outside the clinic was normal - Current blood pressure in clinic is 134/90 mmHg - Does not monitor blood pressure at home  Diabetes mellitus management - Has diabetes mellitus - Daily glucose readings are approximately 113 mg/dL - Takes metformin  and Jardiance  - Attempts glycemic control with diet and activity - Walks about 10,000 steps per day at work  Immunization status - Received influenza vaccination recently - Considering COVID-19 booster vaccination - Aware of shingles vaccination  Preventive health screening - Underwent screening colonoscopy in July 2025 as part of routine health maintenance    Review of Systems  Other than above, a 14 point review of systems is negative     Past Medical History:  Diagnosis Date   Diabetes mellitus 2/09   Dyslipidemia 05/28/2016   Erectile dysfunction    Kidney stones 05/28/2016   Peyronie's disease     Past Surgical History:  Procedure Laterality Date   COLONOSCOPY     Kidney Stone Extraction     POLYPECTOMY      Current Outpatient Medications  Medication Instructions   ACCU-CHEK AVIVA PLUS test strip CHECK BLOOD SUGAR NO MORE THAN TWICE DAILY. NEEDS APPT   Accu-Chek Softclix Lancets lancets CHECK BLOOD SUGAR NO MORE THAN TWICE DAILY   aspirin EC 81 mg, Daily   Blood Glucose Monitoring Suppl (ACCU-CHEK AVIVA PLUS) w/Device KIT Check blood sugar no more than twice daily   empagliflozin  (JARDIANCE ) 25 mg, Oral,  Daily   metFORMIN  (GLUCOPHAGE ) 1,000 mg, Oral, 2 times daily with meals, Needs appt   Multiple Vitamin (MULTIVITAMIN) tablet 1 tablet, Daily       Objective:   Physical Exam BP (!) 134/90 (BP Location: Right Arm, Patient Position: Sitting, Cuff Size: Large)   Pulse 63   Temp 97.9 F (36.6 C) (Oral)   Ht 6' (1.829 m)   Wt 207 lb 3.2 oz (94 kg)   SpO2 95%   BMI 28.10 kg/m  General: Well developed, NAD, BMI noted Neck: No  thyromegaly  HEENT:  Normocephalic . Face symmetric, atraumatic Lungs:  CTA B Normal respiratory effort, no intercostal retractions, no accessory muscle use. Heart: RRR,  no murmur.  Abdomen:  Not distended, soft, non-tender. No rebound or rigidity.   DM foot exam: No edema, pinprick examination normal Skin: Exposed areas without rash. Not pale. Not jaundice Neurologic:  alert & oriented X3.  Speech normal, gait appropriate for age and unassisted Strength symmetric and appropriate for age.  Psych: Cognition and judgment appear intact.  Cooperative with normal attention span and concentration.  Behavior appropriate. No anxious or depressed appearing.     Assessment   Assessment DM 2009 Dyslipidemia elevated TG Kidney stones Rectus diastasis Elevated LFTs, first noted 2014, on metformin  for years . Hepatitis serology (-) 10-2015 Ca+ Coronary score 03/2022: ZERO   Assessment & Plan Recent labs reviewed with the patient. Here for CPX -Td 2023; prevnar 2016;  pnm  23: 05-2016 - Flu shot today - rec a covid booster at the pharmacy -Also recommend to consider Shingrix. Prostate Ca screening: +FH, father age 80; no symptoms, recent PSA normal CCS: FH colon ca- GM FH colon polyps- mother  Cscope 05-2014, flex sig 09-2014 ok, s/p cscope 11-2017: WNL, Cscope 09/2023, next per GI Labs: Recent labs reviewed. Lifestyle: Is doing great, active at work with at least 10,000 steps a day.  Eating healthy. Other issues addressed today: DM type II: Well-controlled  with A1c 6.3%. Blood glucose 113-115 mg/dL. On metformin  and empagliflozin .  Microalbumin pending. Plan: - Continue metformin  and empagliflozin . - Monitor for genital rash from Jardiance . - Continue regular blood glucose monitoring. - DM foot exam negative. Hyperlipidemia LDL 113 mg/dL, improved from 863 mg/dL. Total cholesterol 192 mg/dL, improved from 768 mg/dL. Has been reluctant to take statins, coronary calcium  score was 0 in 2024, explained patient that although his Co Ca+ score is very good,   statins are still indicated due to history of DM and FH. He remains reluctant to take meds but will consider  RTC 6 months    "

## 2024-03-18 NOTE — Patient Instructions (Signed)
 go to the front desk for the checkout Please make an appointment for a checkup in 6 months.

## 2024-03-19 ENCOUNTER — Other Ambulatory Visit: Payer: Self-pay | Admitting: Family

## 2024-03-20 ENCOUNTER — Encounter: Payer: Self-pay | Admitting: Internal Medicine

## 2024-03-20 NOTE — Assessment & Plan Note (Signed)
 Here for CPX -Td 2023; prevnar 2016;  pnm 23: 05-2016 - Flu shot today - rec a covid booster at the pharmacy -Also recommend to consider Shingrix. Prostate Ca screening: +FH, father age 57; no symptoms, recent PSA normal CCS: FH colon ca- GM FH colon polyps- mother  Cscope 05-2014, flex sig 09-2014 ok, s/p cscope 11-2017: WNL, Cscope 09/2023, next per GI Labs: Recent labs reviewed. Lifestyle: Is doing great, active at work with at least 10,000 steps a day.  Eating healthy.

## 2024-03-20 NOTE — Assessment & Plan Note (Signed)
 Here for CPX    Other issues addressed today: DM type II: Well-controlled with A1c 6.3%. Blood glucose 113-115 mg/dL. On metformin  and empagliflozin .  Microalbumin pending. Plan: - Continue metformin  and empagliflozin . - Monitor for genital rash from Jardiance . - Continue regular blood glucose monitoring. - DM foot exam negative. Hyperlipidemia LDL 113 mg/dL, improved from 863 mg/dL. Total cholesterol 192 mg/dL, improved from 768 mg/dL. Has been reluctant to take statins, coronary calcium  score was 0 in 2024, explained patient that although his Co Ca+ score is very good,   statins are still indicated due to history of DM and FH. He remains reluctant to take meds but will consider  RTC 6 months

## 2024-03-21 ENCOUNTER — Other Ambulatory Visit: Payer: Self-pay | Admitting: Family

## 2024-09-16 ENCOUNTER — Ambulatory Visit: Admitting: Internal Medicine
